# Patient Record
Sex: Female | Born: 1995 | Race: Black or African American | Hispanic: No | Marital: Single | Smoking: Never smoker
Health system: Southern US, Community
[De-identification: ages and names within clinical notes are randomized; demographics above are authoritative.]

## PROBLEM LIST (undated history)

## (undated) DIAGNOSIS — Z789 Other specified health status: Secondary | ICD-10-CM

---

## 2016-01-14 ENCOUNTER — Encounter (HOSPITAL_COMMUNITY): Payer: Self-pay | Admitting: Emergency Medicine

## 2016-01-14 DIAGNOSIS — Z5321 Procedure and treatment not carried out due to patient leaving prior to being seen by health care provider: Secondary | ICD-10-CM | POA: Insufficient documentation

## 2016-01-14 DIAGNOSIS — R1110 Vomiting, unspecified: Secondary | ICD-10-CM | POA: Insufficient documentation

## 2016-01-14 LAB — COMPREHENSIVE METABOLIC PANEL
ALBUMIN: 4.3 g/dL (ref 3.5–5.0)
ALK PHOS: 45 U/L (ref 38–126)
ALT: 12 U/L — AB (ref 14–54)
AST: 22 U/L (ref 15–41)
Anion gap: 7 (ref 5–15)
BILIRUBIN TOTAL: 0.8 mg/dL (ref 0.3–1.2)
BUN: 9 mg/dL (ref 6–20)
CALCIUM: 9.5 mg/dL (ref 8.9–10.3)
CO2: 24 mmol/L (ref 22–32)
CREATININE: 0.91 mg/dL (ref 0.44–1.00)
Chloride: 106 mmol/L (ref 101–111)
GFR calc Af Amer: >60 mL/min (ref >60)
GFR calc non Af Amer: >60 mL/min (ref >60)
GLUCOSE: 105 mg/dL — AB (ref 65–99)
Potassium: 3.2 mmol/L — ABNORMAL LOW (ref 3.5–5.1)
SODIUM: 137 mmol/L (ref 135–145)
TOTAL PROTEIN: 7.5 g/dL (ref 6.5–8.1)

## 2016-01-14 LAB — URINALYSIS, ROUTINE W REFLEX MICROSCOPIC
Glucose, UA: NEGATIVE mg/dL
HGB URINE DIPSTICK: NEGATIVE
KETONES UR: NEGATIVE mg/dL
Leukocytes, UA: NEGATIVE
Nitrite: NEGATIVE
PROTEIN: NEGATIVE mg/dL
SPECIFIC GRAVITY, URINE: 1.031 — AB (ref 1.005–1.030)
pH: 5 (ref 5.0–8.0)

## 2016-01-14 LAB — CBC
HCT: 35.8 % — ABNORMAL LOW (ref 36.0–46.0)
Hemoglobin: 12.9 g/dL (ref 12.0–15.0)
MCH: 31.5 pg (ref 26.0–34.0)
MCHC: 36 g/dL (ref 30.0–36.0)
MCV: 87.3 fL (ref 78.0–100.0)
PLATELETS: 220 10*3/uL (ref 150–400)
RBC: 4.1 MIL/uL (ref 3.87–5.11)
RDW: 12.1 % (ref 11.5–15.5)
WBC: 7 10*3/uL (ref 4.0–10.5)

## 2016-01-14 LAB — LIPASE, BLOOD: Lipase: 19 U/L (ref 11–51)

## 2016-01-14 MED ORDER — ONDANSETRON 4 MG PO TBDP
4.0000 mg | ORAL_TABLET | Freq: Once | ORAL | Status: AC | PRN
  Administered 2016-01-14: 4 mg via ORAL

## 2016-01-14 MED ORDER — ONDANSETRON 4 MG PO TBDP
ORAL_TABLET | ORAL | Status: AC
  Filled 2016-01-14: qty 1

## 2016-01-14 NOTE — ED Notes (Signed)
Pt reports emesis and ear congestion since this morning. Pt sts she had not been able to keep anything down since last night.

## 2016-01-15 ENCOUNTER — Emergency Department (HOSPITAL_COMMUNITY)
Admission: EM | Admit: 2016-01-15 | Discharge: 2016-01-15 | Disposition: A | Discharge status: Home / Self Care | Payer: Self-pay | Attending: Dermatology | Admitting: Dermatology

## 2016-02-04 ENCOUNTER — Encounter (HOSPITAL_COMMUNITY): Payer: Self-pay

## 2016-02-04 ENCOUNTER — Emergency Department (HOSPITAL_COMMUNITY)
Admission: EM | Admit: 2016-02-04 | Discharge: 2016-02-04 | Disposition: A | Discharge status: Home / Self Care | Payer: Medicaid Other | Attending: Emergency Medicine | Admitting: Emergency Medicine

## 2016-02-04 DIAGNOSIS — R112 Nausea with vomiting, unspecified: Secondary | ICD-10-CM | POA: Insufficient documentation

## 2016-02-04 LAB — COMPREHENSIVE METABOLIC PANEL
ALT: 15 U/L (ref 14–54)
AST: 25 U/L (ref 15–41)
Albumin: 4.8 g/dL (ref 3.5–5.0)
Alkaline Phosphatase: 47 U/L (ref 38–126)
Anion gap: 6 (ref 5–15)
BUN: 8 mg/dL (ref 6–20)
CO2: 27 mmol/L (ref 22–32)
Calcium: 9.3 mg/dL (ref 8.9–10.3)
Chloride: 105 mmol/L (ref 101–111)
Creatinine, Ser: 0.74 mg/dL (ref 0.44–1.00)
GFR calc Af Amer: >60 mL/min (ref >60)
GFR calc non Af Amer: >60 mL/min (ref >60)
Glucose, Bld: 97 mg/dL (ref 65–99)
Potassium: 3.8 mmol/L (ref 3.5–5.1)
Sodium: 138 mmol/L (ref 135–145)
Total Bilirubin: 0.5 mg/dL (ref 0.3–1.2)
Total Protein: 8.3 g/dL — ABNORMAL HIGH (ref 6.5–8.1)

## 2016-02-04 LAB — CBC
HCT: 38 % (ref 36.0–46.0)
Hemoglobin: 13.2 g/dL (ref 12.0–15.0)
MCH: 31.7 pg (ref 26.0–34.0)
MCHC: 34.7 g/dL (ref 30.0–36.0)
MCV: 91.3 fL (ref 78.0–100.0)
Platelets: 242 10*3/uL (ref 150–400)
RBC: 4.16 MIL/uL (ref 3.87–5.11)
RDW: 12.1 % (ref 11.5–15.5)
WBC: 8.1 10*3/uL (ref 4.0–10.5)

## 2016-02-04 LAB — URINALYSIS, ROUTINE W REFLEX MICROSCOPIC
Bilirubin Urine: NEGATIVE
Glucose, UA: NEGATIVE mg/dL
Hgb urine dipstick: NEGATIVE
Ketones, ur: NEGATIVE mg/dL
Leukocytes, UA: NEGATIVE
Nitrite: NEGATIVE
Protein, ur: 100 mg/dL — AB
Specific Gravity, Urine: 1.025 (ref 1.005–1.030)
pH: 8.5 — ABNORMAL HIGH (ref 5.0–8.0)

## 2016-02-04 LAB — URINE MICROSCOPIC-ADD ON

## 2016-02-04 LAB — LIPASE, BLOOD: Lipase: 15 U/L (ref 11–51)

## 2016-02-04 LAB — I-STAT BETA HCG BLOOD, ED (MC, WL, AP ONLY): I-stat hCG, quantitative: <5 m[IU]/mL (ref <5)

## 2016-02-04 MED ORDER — SODIUM CHLORIDE 0.9 % IV BOLUS (SEPSIS)
1000.0000 mL | Freq: Once | INTRAVENOUS | Status: AC
Start: 2016-02-04 — End: 2016-02-04
  Administered 2016-02-04: 1000 mL via INTRAVENOUS

## 2016-02-04 MED ORDER — ONDANSETRON HCL 4 MG PO TABS: 4.0000 mg | ORAL_TABLET | Freq: Four times a day (QID) | ORAL | Status: DC

## 2016-02-04 MED ORDER — PROMETHAZINE HCL 25 MG/ML IJ SOLN
25.0000 mg | Freq: Once | INTRAMUSCULAR | Status: AC
  Administered 2016-02-04: 25 mg via INTRAVENOUS
  Filled 2016-02-04: qty 1

## 2016-02-04 NOTE — ED Notes (Signed)
She is resting quietly in the presence of her boyfriend.

## 2016-02-04 NOTE — Discharge Instructions (Signed)
[facility],  Nice meeting you! Please follow-up with your primary care provider. Return to the emergency department if you develop fevers, are unable to keep foods down, have increasing pain, new/worsening symptoms. Feel better soon!  S. Lane HackerNicole Conn Trombetta, PA-C Nausea and Vomiting Nausea is a sick feeling that often comes before throwing up (vomiting). Vomiting is a reflex where stomach contents come out of your mouth. Vomiting can cause severe loss of body fluids (dehydration). Children and elderly adults can become dehydrated quickly, especially if they also have diarrhea. Nausea and vomiting are symptoms of a condition or disease. It is important to find the cause of your symptoms. CAUSES   Direct irritation of the stomach lining. This irritation can result from increased acid production (gastroesophageal reflux disease), infection, food poisoning, taking certain medicines (such as nonsteroidal anti-inflammatory drugs), alcohol use, or tobacco use.  Signals from the brain.These signals could be caused by a headache, heat exposure, an inner ear disturbance, increased pressure in the brain from injury, infection, a tumor, or a concussion, pain, emotional stimulus, or metabolic problems.  An obstruction in the gastrointestinal tract (bowel obstruction).  Illnesses such as diabetes, hepatitis, gallbladder problems, appendicitis, kidney problems, cancer, sepsis, atypical symptoms of a heart attack, or eating disorders.  Medical treatments such as chemotherapy and radiation.  Receiving medicine that makes you sleep (general anesthetic) during surgery. DIAGNOSIS Your caregiver may ask for tests to be done if the problems do not improve after a few days. Tests may also be done if symptoms are severe or if the reason for the nausea and vomiting is not clear. Tests may include:  Urine tests.  Blood tests.  Stool tests.  Cultures (to look for evidence of infection).  X-rays or other  imaging studies. Test results can help your caregiver make decisions about treatment or the need for additional tests. TREATMENT You need to stay well hydrated. Drink frequently but in small amounts.You may wish to drink water, sports drinks, clear broth, or eat frozen ice pops or gelatin dessert to help stay hydrated.When you eat, eating slowly may help prevent nausea.There are also some antinausea medicines that may help prevent nausea. HOME CARE INSTRUCTIONS   Take all medicine as directed by your caregiver.  If you do not have an appetite, do not force yourself to eat. However, you must continue to drink fluids.  If you have an appetite, eat a normal diet unless your caregiver tells you differently.  Eat a variety of complex carbohydrates (rice, wheat, potatoes, bread), lean meats, yogurt, fruits, and vegetables.  Avoid high-fat foods because they are more difficult to digest.  Drink enough water and fluids to keep your urine clear or pale yellow.  If you are dehydrated, ask your caregiver for specific rehydration instructions. Signs of dehydration may include:  Severe thirst.  Dry lips and mouth.  Dizziness.  Dark urine.  Decreasing urine frequency and amount.  Confusion.  Rapid breathing or pulse. SEEK IMMEDIATE MEDICAL CARE IF:   You have blood or brown flecks (like coffee grounds) in your vomit.  You have black or bloody stools.  You have a severe headache or stiff neck.  You are confused.  You have severe abdominal pain.  You have chest pain or trouble breathing.  You do not urinate at least once every 8 hours.  You develop cold or clammy skin.  You continue to vomit for longer than 24 to 48 hours.  You have a fever. MAKE SURE YOU:   Understand  these instructions.  Will watch your condition.  Will get help right away if you are not doing well or get worse.   This information is not intended to replace advice given to you by your health care  provider. Make sure you discuss any questions you have with your health care provider.   Document Released: 07/10/2005 Document Revised: 10/02/2011 Document Reviewed: 12/07/2010 Elsevier Interactive Patient Education [employer].

## 2016-02-04 NOTE — ED Notes (Signed)
Pt here with emesis and chills.  Pt had similar 3 weeks ago and [facility] but did not finish assessment.  Symptoms started today.  Bile yellow emesis.  No fever.  No change in urination or discharge.  Cannot tell when last menstrual was.  Using no birth control.

## 2016-03-05 NOTE — ED Provider Notes (Signed)
[facility]-EMERGENCY DEPT Provider Note   CSN: [id number] Arrival date & time: 02/04/16  1411  First Provider Contact:  First MD Initiated Contact with Patient 02/04/16 1446        History   Chief Complaint Chief Complaint  Patient presents with  . Emesis    HPI   HPI   Kendel Pesnell is a 20 y.o. female presenting with a 1 day history of emesis and chills. She describes her emesis as bilious. She denies fevers, hematemesis, abdominal pain, changes in bowel/bladder habits, urinary or vaginal complaints.   History reviewed. No pertinent past medical history.  There are no active problems to display for this patient.   History reviewed. No pertinent surgical history.  OB History    No data available       Home Medications    Prior to Admission medications   Medication Sig Start Date End Date Taking? Authorizing Provider  ondansetron (ZOFRAN) 4 MG tablet Take 1 tablet (4 mg total) by mouth every 6 (six) hours. 02/04/16   Melton Krebs, PA-C    Family History History reviewed. No pertinent family history.  Social History Social History  Substance Use Topics  . Smoking status: Never Smoker  . Smokeless tobacco: Not on file  . Alcohol use Yes     Comment: occasional     Allergies   Review of patient's allergies indicates no known allergies.   Review of Systems Review of Systems Ten systems are reviewed and are negative for acute change except as noted in the HPI   Physical Exam Updated Vital Signs BP 108/77 (BP Location: Left Arm)   Pulse 65   Temp 98.3 F (36.8 C) (Oral)   Resp 16   LMP 09/16/2015   SpO2 100%   Physical Exam  Constitutional: She appears well-developed and well-nourished. No distress.  HENT:  Head: Normocephalic and atraumatic.  Mouth/Throat: Oropharynx is clear and moist. No oropharyngeal exudate.  Eyes: Conjunctivae are normal. Pupils are equal, round, and reactive to light. Right eye exhibits no discharge. Left eye  exhibits no discharge. No scleral icterus.  Neck: No tracheal deviation present.  Cardiovascular: Normal rate, regular rhythm, normal heart sounds and intact distal pulses.  Exam reveals no gallop and no friction rub.   No murmur heard. Pulmonary/Chest: Effort normal and breath sounds normal. No respiratory distress. She has no wheezes. She has no rales. She exhibits no tenderness.  Abdominal: Soft. Bowel sounds are normal. She exhibits no distension and no mass. There is no tenderness. There is no rebound and no guarding.  Musculoskeletal: She exhibits no edema.  Lymphadenopathy:    She has no cervical adenopathy.  Neurological: She is alert. Coordination normal.  Skin: Skin is warm and dry. No rash noted. She is not diaphoretic. No erythema.  Psychiatric: She has a normal mood and affect. Her behavior is normal.  Nursing note and vitals reviewed.    ED Treatments / Results  Labs (all labs ordered are listed, but only abnormal results are displayed) Labs Reviewed  COMPREHENSIVE METABOLIC PANEL - Abnormal; Notable for the following:       Result Value   Total Protein 8.3 (*)    All other components within normal limits  URINALYSIS, ROUTINE W REFLEX MICROSCOPIC (NOT AT [facility]) - Abnormal; Notable for the following:    APPearance CLOUDY (*)    pH 8.5 (*)    Protein, ur 100 (*)    All other components within normal limits  URINE MICROSCOPIC-ADD ON -  Abnormal; Notable for the following:    Squamous Epithelial / LPF 6-30 (*)    Bacteria, UA FEW (*)    All other components within normal limits  LIPASE, BLOOD  CBC  I-STAT BETA HCG BLOOD, ED ([facility], [facility], [facility] ONLY)    EKG  EKG Interpretation None       Radiology No results found.  Procedures Procedures (including critical care time)  Medications Ordered in ED Medications  sodium chloride 0.9 % bolus 1,000 mL (0 mLs Intravenous Stopped 02/04/16 1704)  promethazine (PHENERGAN) injection 25 mg (25 mg Intravenous Given 02/04/16 1531)       Initial Impression / Assessment and Plan / ED Course  I have reviewed the triage vital signs and the nursing notes.  Pertinent labs & imaging results that were available during my care of the patient were reviewed by me and considered in my medical decision making (see chart for details).  Clinical Course     Final Clinical Impressions(s) / ED Diagnoses   Final diagnoses:  Nausea and vomiting, vomiting of unspecified type   Patient nontoxic appearing, VSS. Labs unremarkable. Patient nontender on exam. Patient may be safely discharged home. Discussed reasons for return. Patient to follow-up with primary care provider within one week. Patient in understanding and agreement with the plan.   New Prescriptions There are no discharge medications for this patient.    Melton KrebsSamantha Nicole Sherly Brodbeck, PA-C 03/05/16 2110    Tilden FossaElizabeth Rees, MD 03/08/16 [phone]

## 2016-06-26 DIAGNOSIS — S022XXD Fracture of nasal bones, subsequent encounter for fracture with routine healing: Secondary | ICD-10-CM | POA: Insufficient documentation

## 2017-01-30 DIAGNOSIS — O26841 Uterine size-date discrepancy, first trimester: Secondary | ICD-10-CM | POA: Diagnosis not present

## 2017-01-30 DIAGNOSIS — Z3A01 Less than 8 weeks gestation of pregnancy: Secondary | ICD-10-CM | POA: Diagnosis not present

## 2017-01-30 DIAGNOSIS — E559 Vitamin D deficiency, unspecified: Secondary | ICD-10-CM | POA: Diagnosis not present

## 2018-05-19 ENCOUNTER — Emergency Department (HOSPITAL_COMMUNITY)
Admission: EM | Admit: 2018-05-19 | Discharge: 2018-05-19 | Disposition: A | Discharge status: Home / Self Care | Payer: Medicaid Other | Attending: Emergency Medicine | Admitting: Emergency Medicine

## 2018-05-19 ENCOUNTER — Other Ambulatory Visit: Payer: Self-pay

## 2018-05-19 ENCOUNTER — Encounter (HOSPITAL_COMMUNITY): Payer: Self-pay | Admitting: *Deleted

## 2018-05-19 DIAGNOSIS — R51 Headache: Secondary | ICD-10-CM | POA: Diagnosis not present

## 2018-05-19 DIAGNOSIS — R112 Nausea with vomiting, unspecified: Secondary | ICD-10-CM | POA: Insufficient documentation

## 2018-05-19 DIAGNOSIS — R519 Headache, unspecified: Secondary | ICD-10-CM

## 2018-05-19 MED ORDER — PROCHLORPERAZINE EDISYLATE 10 MG/2ML IJ SOLN
10.0000 mg | Freq: Once | INTRAMUSCULAR | Status: AC
  Administered 2018-05-19: 10 mg via INTRAVENOUS
  Filled 2018-05-19: qty 2

## 2018-05-19 MED ORDER — SODIUM CHLORIDE 0.9 % IV BOLUS
500.0000 mL | Freq: Once | INTRAVENOUS | Status: AC
  Administered 2018-05-19: 500 mL via INTRAVENOUS

## 2018-05-19 MED ORDER — KETOROLAC TROMETHAMINE 30 MG/ML IJ SOLN
30.0000 mg | Freq: Once | INTRAMUSCULAR | Status: AC
  Administered 2018-05-19: 30 mg via INTRAVENOUS
  Filled 2018-05-19: qty 1

## 2018-05-19 MED ORDER — DIPHENHYDRAMINE HCL 50 MG/ML IJ SOLN
12.5000 mg | Freq: Once | INTRAMUSCULAR | Status: AC
  Administered 2018-05-19: 12.5 mg via INTRAVENOUS
  Filled 2018-05-19: qty 1

## 2018-05-19 NOTE — ED Triage Notes (Signed)
The pt is c/o a headache since yesterday  She has not taken anything  Over the counter for the pain  lmp this month

## 2018-05-19 NOTE — Discharge Instructions (Signed)
It was my pleasure taking care of you today!  Please follow up with your primary care doctor. If you do not have one, please see the information below.    Fortunately, your evaluation today is reassuring with no apparent emergent cause for your headache at this time. With that being said, it is VERY important that you monitor your symptoms at home. If you develop worsening headache, new fever, new neck stiffness, rash, weakness, numbness, trouble with your speech, trouble walking, new or worsening symptoms or any concerning symptoms, please return to the ED immediately.   To find a primary care or specialty doctor please call [phone] or [phone] to access "[place] Find a Doctor Service."  You may also go on the [facility] website at [email or web address]  There are also multiple [facility] practices throughout the [place] that are frequently accepting new patients. You may find a clinic that is close to your home and contact them.  [facility] [place] [address] [place] [address] [phone]  [facility] in [place] (also locations in [place] and [place]) - [address] [employer] [place] [phone]  [facility] - [address] [place] [phone]

## 2018-05-19 NOTE — ED Provider Notes (Signed)
[facility] EMERGENCY DEPARTMENT Provider Note   CSN: [id number] Arrival date & time: 05/19/18  1843     History   Chief Complaint Chief Complaint  Patient presents with  . Headache    HPI Makaia Rappa is a 22 y.o. female.  The history is provided by the patient and medical records. No language interpreter was used.  Headache   Associated symptoms include nausea. Pertinent negatives include no vomiting.   Maisy [place] is an otherwise healthy 22 y.o. female who presents to the emergency department complaining of a left-sided headache which began yesterday.  Associated with photophobia and nausea.  She has had a couple episodes of emesis as well.  No visual changes, numbness, weakness, tingling, neck pain or fevers.  She has not tried any medications prior to arrival for her symptoms.  She states that she tripped and fell down the stairs 2 weeks ago.  At that time she believes that she had her head, but did not lose consciousness.  She has never had any nausea or vomiting at that time.  She did not have a headache.  She felt fine.  When her headache began last night, she became concerned that P this was an injury from her fall 2 weeks ago.  History reviewed. No pertinent past medical history.  There are no active problems to display for this patient.   History reviewed. No pertinent surgical history.   OB History   None      Home Medications    Prior to Admission medications   Medication Sig Start Date End Date Taking? Authorizing Provider  ondansetron (ZOFRAN) 4 MG tablet Take 1 tablet (4 mg total) by mouth every 6 (six) hours. 02/04/16   Melton Krebs, PA-C    Family History No family history on file.  Social History Social History   Tobacco Use  . Smoking status: Never Smoker  . Smokeless tobacco: Never Used  Substance Use Topics  . Alcohol use: Yes    Comment: occasional  . Drug use: No     Allergies   Patient has  no known allergies.   Review of Systems Review of Systems  Gastrointestinal: Positive for nausea. Negative for abdominal pain, constipation, diarrhea and vomiting.  Neurological: Positive for headaches. Negative for dizziness, syncope, speech difficulty, weakness and numbness.  All other systems reviewed and are negative.    Physical Exam Updated Vital Signs BP 114/88 (BP Location: Right Arm)   Pulse 85   Temp 99.1 F (37.3 C) (Oral)   Resp 16   Ht 5\' 6"  (1.676 m)   Wt 49.9 kg   LMP 05/07/2018   SpO2 99%   BMI 17.76 kg/m   Physical Exam  Constitutional: She is oriented to person, place, and time. She appears well-developed and well-nourished. No distress.  HENT:  Head: Normocephalic and atraumatic.  Mouth/Throat: Oropharynx is clear and moist.  Eyes: Pupils are equal, round, and reactive to light. Conjunctivae and EOM are normal. No scleral icterus.  No nystagmus   Neck: Normal range of motion. Neck supple.  Full active and passive ROM without pain.  No midline or paraspinal tenderness. No nuchal rigidity or meningeal signs.  Cardiovascular: Normal rate, regular rhythm, normal heart sounds and intact distal pulses.  Pulmonary/Chest: Effort normal and breath sounds normal. No respiratory distress. She has no wheezes. She has no rales.  Abdominal: Soft. Bowel sounds are normal. She exhibits no distension. There is no tenderness. There is no rebound and  no guarding.  Musculoskeletal: Normal range of motion.  Lymphadenopathy:    She has no cervical adenopathy.  Neurological: She is alert and oriented to person, place, and time. She has normal reflexes. No cranial nerve deficit. Coordination normal.  Alert, oriented, thought content appropriate, able to give a coherent history. Speech is clear and goal oriented, able to follow commands.  Cranial Nerves:  II:  Peripheral visual fields grossly normal, pupils equal, round, reactive to light III, IV, VI: EOM intact bilaterally,  ptosis not present V,VII: smile symmetric, eyes kept closed tightly against resistance, facial light touch sensation equal VIII: hearing grossly normal IX, X: symmetric soft palate movement, uvula elevates symmetrically  XI: bilateral shoulder shrug symmetric and strong XII: midline tongue extension 5/5 muscle strength in upper and lower extremities bilaterally including strong and equal grip strength and dorsiflexion/plantar flexion Sensory to light touch normal in all four extremities.  Normal finger-to-nose and rapid alternating movements. No drift. Steady gait.  Skin: Skin is warm and dry. No rash noted. She is not diaphoretic.  Nursing note and vitals reviewed.    ED Treatments / Results  Labs (all labs ordered are listed, but only abnormal results are displayed) Labs Reviewed - No data to display  EKG None  Radiology No results found.  Procedures Procedures (including critical care time)  Medications Ordered in ED Medications  sodium chloride 0.9 % bolus 500 mL (500 mLs Intravenous New Bag/Given 05/19/18 2102)  ketorolac (TORADOL) 30 MG/ML injection 30 mg (30 mg Intravenous Given 05/19/18 2108)  prochlorperazine (COMPAZINE) injection 10 mg (10 mg Intravenous Given 05/19/18 2111)  diphenhydrAMINE (BENADRYL) injection 12.5 mg (12.5 mg Intravenous Given 05/19/18 2112)     Initial Impression / Assessment and Plan / ED Course  I have reviewed the triage vital signs and the nursing notes.  Pertinent labs & imaging results that were available during my care of the patient were reviewed by me and considered in my medical decision making (see chart for details).     Arlita Buffkin is a 22 y.o. female who presents to ED for headache which began last night. No focal neuro deficits on exam. Migraine cocktail and fluids given.   On re-evaluation, patient feels much improved. The patient denies any neurologic symptoms such as visual changes, focal numbness/weakness, balance  problems, confusion, or speech difficulty to suggest a life-threatening intracranial process such as intracranial hemorrhage or mass. The patient has no clotting risk factors thus venous sinus thrombosis is unlikely. No fevers, neck pain or nuchal rigidity to suggest meningitis. I feel that the patient is safe for discharge home at this time. PCP follow up strongly encouraged. I have reviewed return precautions including development of neurologic symptoms, confusion, lethargy, difficulty speaking, or new/worsening/concerning symptoms. All questions answered.   Final Clinical Impressions(s) / ED Diagnoses   Final diagnoses:  Bad headache    ED Discharge Orders    None       Deron Poole, Chase Picket, PA-C 05/19/18 2201    Virgina Norfolk, DO 05/19/18 2333

## 2018-05-29 ENCOUNTER — Encounter (HOSPITAL_COMMUNITY): Payer: Self-pay | Admitting: Emergency Medicine

## 2018-05-29 ENCOUNTER — Emergency Department (HOSPITAL_COMMUNITY)
Admission: EM | Admit: 2018-05-29 | Discharge: 2018-05-29 | Disposition: A | Discharge status: Home / Self Care | Payer: Medicaid Other | Attending: Emergency Medicine | Admitting: Emergency Medicine

## 2018-05-29 ENCOUNTER — Other Ambulatory Visit: Payer: Self-pay

## 2018-05-29 DIAGNOSIS — H60391 Other infective otitis externa, right ear: Secondary | ICD-10-CM | POA: Diagnosis not present

## 2018-05-29 DIAGNOSIS — Z79899 Other long term (current) drug therapy: Secondary | ICD-10-CM | POA: Insufficient documentation

## 2018-05-29 DIAGNOSIS — H9201 Otalgia, right ear: Secondary | ICD-10-CM | POA: Diagnosis present

## 2018-05-29 MED ORDER — CIPROFLOXACIN-DEXAMETHASONE 0.3-0.1 % OT SUSP
4.0000 [drp] | Freq: Two times a day (BID) | OTIC | Status: AC
Start: 2018-05-29 — End: 2018-05-29
  Administered 2018-05-29: 4 [drp] via OTIC
  Filled 2018-05-29: qty 7.5

## 2018-05-29 NOTE — ED Triage Notes (Signed)
Patient with right ear pain for the last two days.  She states that she still having muffled sounds after trying some home remedies to help.  She states she tried irrigation with no luck of pain reduction and she thinks that she made it worse.

## 2018-05-29 NOTE — ED Provider Notes (Signed)
[facility] Provider Note   CSN: [id number] Arrival date & time: 05/29/18  2157     History   Chief Complaint Chief Complaint  Patient presents with  . Otalgia    HPI Lauren Hanna is a 22 y.o. female.  The history is provided by the patient and medical records.  Otalgia      22 year old female visiting ED with right ear pain.  States she feels like her ear is clogged.  She has been using OTC ear drops and trying to clean out her ear with q-tips.  States she thinks she actually made it worse.  States over the past 2 days her hearing sounds very muffled and distant.  No right ear issues.    History reviewed. No pertinent past medical history.  There are no active problems to display for this patient.   History reviewed. No pertinent surgical history.   OB History   None      Home Medications    Prior to Admission medications   Medication Sig Start Date End Date Taking? Authorizing Provider  ondansetron (ZOFRAN) 4 MG tablet Take 1 tablet (4 mg total) by mouth every 6 (six) hours. 02/04/16   Melton Krebs, PA-C    Family History No family history on file.  Social History Social History   Tobacco Use  . Smoking status: Never Smoker  . Smokeless tobacco: Never Used  Substance Use Topics  . Alcohol use: Yes    Comment: occasional  . Drug use: No     Allergies   Patient has no known allergies.   Review of Systems Review of Systems  HENT: Positive for ear pain.   All other systems reviewed and are negative.    Physical Exam Updated Vital Signs BP 106/90 (BP Location: Right Arm)   Pulse 63   Temp 98 F (36.7 C) (Oral)   Resp 16   Ht 5\' 5"  (1.651 m)   Wt 63.5 kg   LMP 05/07/2018   SpO2 100%   BMI 23.30 kg/m   Physical Exam  Constitutional: She is oriented to person, place, and time. She appears well-developed and well-nourished.  HENT:  Head: Normocephalic and atraumatic.    Mouth/Throat: Oropharynx is clear and moist.  Right EAC is swollen with some yellow drainage in the canal; canal is tender to palpation and pain noted with pressure applied to tragus; TM appears clear  Eyes: Pupils are equal, round, and reactive to light. Conjunctivae and EOM are normal.  Neck: Normal range of motion.  Cardiovascular: Normal rate, regular rhythm and normal heart sounds.  Pulmonary/Chest: Effort normal and breath sounds normal.  Abdominal: Soft. Bowel sounds are normal.  Musculoskeletal: Normal range of motion.  Neurological: She is alert and oriented to person, place, and time.  Skin: Skin is warm and dry.  Psychiatric: She has a normal mood and affect.  Nursing note and vitals reviewed.    ED Treatments / Results  Labs (all labs ordered are listed, but only abnormal results are displayed) Labs Reviewed - No data to display  EKG None  Radiology No results found.  Procedures Procedures (including critical care time)  Medications Ordered in ED Medications  ciprofloxacin-dexamethasone (CIPRODEX) 0.3-0.1 % OTIC (EAR) suspension 4 drop (4 drops Right EAR Given 05/29/18 2356)     Initial Impression / Assessment and Plan / ED Course  I have reviewed the triage vital signs and the nursing notes.  Pertinent labs & imaging results that  were available during my care of the patient were reviewed by me and considered in my medical decision making (see chart for details).  22 year old female presenting to the ED with right ear pain.  States it feels "clogged" and now her hearing is muffled.  Has used over-the-counter eardrops and tried to clean her ears with Q-tips but thinks she made it worse.  On exam her right EAC is swollen and very tender to the touch.  She does have a small amount of yellow drainage present in the canal.  TM overall appears clear.  This is consistent with otitis externa.  She was started on Ciprodex drops, given first dose here and will continue at  home.  Follow-up closely with PCP.  She will return here for any new or worsening symptoms.  Final Clinical Impressions(s) / ED Diagnoses   Final diagnoses:  Other infective acute otitis externa of right ear    ED Discharge Orders    None       Garlon Hatchet, PA-C 05/30/18 0000    Raeford Razor, MD 05/30/18 1000

## 2018-05-29 NOTE — ED Notes (Signed)
Patient verbalizes understanding of discharge instructions. Opportunity for questioning and answers were provided. Armband removed by staff, pt discharged from ED home via POV.  

## 2018-05-29 NOTE — Discharge Instructions (Signed)
Continue using ear drops as directed. Follow-up with your primary care doctor. Return here for any new/acute changes.

## 2018-07-24 NOTE — L&D Delivery Note (Signed)
Patient: Lauren Hanna MRN: [id number]  GBS status: negative, IAP given NA  Patient is a 23 y.o. now G2P1001 s/p NSVD at [redacted]w[redacted]d, who was admitted for IOL. SROM 0h 38m prior to delivery with clear fluid.   Delivery Note At 10:58 PM a viable female was delivered via Vaginal, Spontaneous (Presentation:vertex ; LOA).  APGAR: 8, 9; weight pending .   Placenta status:delivered,intact.  Cord: 3-vessel with the following complications:none .  Cord pH: pending  Anesthesia:  none Episiotomy: None Lacerations: mild superficial intra-vaginal. Achieved hemostasis with manual pressure Suture Repair: none Est. Blood Loss (mL):    Mom to postpartum.  Baby to Couplet care / Skin to Skin.  Jaja Switalski L Mali Eppard 06/10/2019, 11:15 PM   Head delivered LOA. No nuchal cord present. Shoulder and body delivered in usual fashion. Infant with spontaneous cry, placed on mother's abdomen, dried and bulb suctioned. Cord clamped x 2 after 1-minute delay, and cut by father. Cord blood drawn. Placenta delivered spontaneously with gentle cord traction. IUD placed manually and cords cut at vaginal introitus. Fundus firm with massage and Pitocin. Perineum inspected and found to have mild superficial laceration, which was found to be hemostatic with manual pressure.

## 2018-10-14 DIAGNOSIS — R05 Cough: Secondary | ICD-10-CM | POA: Diagnosis not present

## 2018-10-14 DIAGNOSIS — J029 Acute pharyngitis, unspecified: Secondary | ICD-10-CM | POA: Diagnosis not present

## 2018-10-24 DIAGNOSIS — Z3201 Encounter for pregnancy test, result positive: Secondary | ICD-10-CM | POA: Diagnosis not present

## 2018-11-26 DIAGNOSIS — Z3201 Encounter for pregnancy test, result positive: Secondary | ICD-10-CM | POA: Diagnosis not present

## 2018-11-27 DIAGNOSIS — O26891 Other specified pregnancy related conditions, first trimester: Secondary | ICD-10-CM | POA: Diagnosis not present

## 2018-11-27 DIAGNOSIS — R102 Pelvic and perineal pain: Secondary | ICD-10-CM | POA: Diagnosis not present

## 2018-11-27 DIAGNOSIS — O26841 Uterine size-date discrepancy, first trimester: Secondary | ICD-10-CM | POA: Diagnosis not present

## 2018-11-27 DIAGNOSIS — Z3A12 12 weeks gestation of pregnancy: Secondary | ICD-10-CM | POA: Diagnosis not present

## 2018-11-27 DIAGNOSIS — R1030 Lower abdominal pain, unspecified: Secondary | ICD-10-CM | POA: Diagnosis not present

## 2018-12-18 DIAGNOSIS — Z3201 Encounter for pregnancy test, result positive: Secondary | ICD-10-CM | POA: Diagnosis not present

## 2018-12-18 DIAGNOSIS — Z113 Encounter for screening for infections with a predominantly sexual mode of transmission: Secondary | ICD-10-CM | POA: Diagnosis not present

## 2018-12-18 DIAGNOSIS — N912 Amenorrhea, unspecified: Secondary | ICD-10-CM | POA: Diagnosis not present

## 2018-12-18 DIAGNOSIS — O26819 Pregnancy related exhaustion and fatigue, unspecified trimester: Secondary | ICD-10-CM | POA: Diagnosis not present

## 2018-12-18 DIAGNOSIS — O26811 Pregnancy related exhaustion and fatigue, first trimester: Secondary | ICD-10-CM | POA: Diagnosis not present

## 2019-01-15 DIAGNOSIS — Z36 Encounter for antenatal screening for chromosomal anomalies: Secondary | ICD-10-CM | POA: Diagnosis not present

## 2019-01-21 DIAGNOSIS — Z363 Encounter for antenatal screening for malformations: Secondary | ICD-10-CM | POA: Diagnosis not present

## 2019-02-17 ENCOUNTER — Inpatient Hospital Stay (HOSPITAL_COMMUNITY)
Admission: EM | Admit: 2019-02-17 | Discharge: 2019-02-17 | Disposition: A | Discharge status: Home / Self Care | Payer: Medicaid Other | Attending: Obstetrics and Gynecology | Admitting: Obstetrics and Gynecology

## 2019-02-17 ENCOUNTER — Encounter (HOSPITAL_COMMUNITY): Payer: Self-pay

## 2019-02-17 ENCOUNTER — Other Ambulatory Visit: Payer: Self-pay

## 2019-02-17 DIAGNOSIS — D649 Anemia, unspecified: Secondary | ICD-10-CM | POA: Insufficient documentation

## 2019-02-17 DIAGNOSIS — R519 Headache, unspecified: Secondary | ICD-10-CM

## 2019-02-17 DIAGNOSIS — Z3A25 25 weeks gestation of pregnancy: Secondary | ICD-10-CM | POA: Diagnosis not present

## 2019-02-17 DIAGNOSIS — O99012 Anemia complicating pregnancy, second trimester: Secondary | ICD-10-CM

## 2019-02-17 DIAGNOSIS — R51 Headache: Secondary | ICD-10-CM | POA: Insufficient documentation

## 2019-02-17 DIAGNOSIS — O26892 Other specified pregnancy related conditions, second trimester: Secondary | ICD-10-CM | POA: Insufficient documentation

## 2019-02-17 HISTORY — DX: Other specified health status: Z78.9

## 2019-02-17 LAB — CBC
HCT: 30.8 % — ABNORMAL LOW (ref 36.0–46.0)
Hemoglobin: 10.8 g/dL — ABNORMAL LOW (ref 12.0–15.0)
MCH: 32.5 pg (ref 26.0–34.0)
MCHC: 35.1 g/dL (ref 30.0–36.0)
MCV: 92.8 fL (ref 80.0–100.0)
Platelets: 205 10*3/uL (ref 150–400)
RBC: 3.32 MIL/uL — ABNORMAL LOW (ref 3.87–5.11)
RDW: 13 % (ref 11.5–15.5)
WBC: 11.8 10*3/uL — ABNORMAL HIGH (ref 4.0–10.5)
nRBC: 0 % (ref 0.0–0.2)

## 2019-02-17 LAB — URINALYSIS, ROUTINE W REFLEX MICROSCOPIC
Bilirubin Urine: NEGATIVE
Glucose, UA: NEGATIVE mg/dL
Hgb urine dipstick: NEGATIVE
Ketones, ur: NEGATIVE mg/dL
Leukocytes,Ua: NEGATIVE
Nitrite: NEGATIVE
Protein, ur: NEGATIVE mg/dL
Specific Gravity, Urine: 1.008 (ref 1.005–1.030)
pH: 7 (ref 5.0–8.0)

## 2019-02-17 MED ORDER — DEXAMETHASONE SODIUM PHOSPHATE 10 MG/ML IJ SOLN
10.0000 mg | Freq: Once | INTRAMUSCULAR | Status: AC
  Administered 2019-02-17: 10 mg via INTRAVENOUS
  Filled 2019-02-17: qty 1

## 2019-02-17 MED ORDER — METOCLOPRAMIDE HCL 5 MG/ML IJ SOLN
10.0000 mg | Freq: Once | INTRAMUSCULAR | Status: AC
  Administered 2019-02-17: 10 mg via INTRAVENOUS
  Filled 2019-02-17: qty 2

## 2019-02-17 MED ORDER — LACTATED RINGERS IV BOLUS
1000.0000 mL | Freq: Once | INTRAVENOUS | Status: AC
  Administered 2019-02-17: 1000 mL via INTRAVENOUS

## 2019-02-17 MED ORDER — DIPHENHYDRAMINE HCL 50 MG/ML IJ SOLN
25.0000 mg | Freq: Once | INTRAMUSCULAR | Status: AC
  Administered 2019-02-17: 25 mg via INTRAVENOUS
  Filled 2019-02-17: qty 1

## 2019-02-17 NOTE — Discharge Instructions (Signed)
[place] for [employer] at [facility]       Phone: [phone]  [place] for [employer] at [place]   Phone: [place] for [employer] at [place]  Phone: [place] for [employer] at [facility] [employer]  Phone: [place] for [employer] at [place]  Phone: [place] for [place] at [facility]   Phone: [facility]       Phone: [phone]  [facility]    Phone: [phone]   [facility]    Phone: [phone]  [facility]    Phone: [place]    Phone: [phone]  [place] [facility]       Phone: [phone]   [place] [facility]  Phone: [place]    Phone: [phone]  [facility]   Phone: [phone]  [facility]      Phone: [phone]  [facility]    Phone: [phone]    General Headache Without Cause A headache is pain or discomfort that is felt around the head or neck area. There are many causes and types of headaches. In some cases, the cause may not be found. Follow these instructions at home: Watch your condition for any changes. Let your doctor know about them. Take these steps to help with your condition: Managing pain      Take over-the-counter and prescription medicines only as told by your doctor.  Lie down in a dark, quiet room when you have a headache.  If told, put ice on your head and neck area: ? Put ice in a plastic bag. ? Place a towel between your skin and the bag. ? Leave the ice on for 20 minutes, 2-3 times per day.  If told, put heat on the affected area. Use the heat source that your doctor recommends, such as a moist heat pack or a  heating pad. ? Place a towel between your skin and the heat source. ? Leave the heat on for 20-30 minutes. ? Remove the heat if your skin turns bright red. This is very important if you are unable to feel pain, heat, or cold. You may have a greater risk of getting burned.  Keep lights dim if bright lights bother you or make your headaches worse. Eating and drinking  Eat meals on a regular schedule.  If you drink alcohol: ? Limit how much you use to:  0-1 drink a day for women.  0-2 drinks a day for men. ? Be aware of how much alcohol is in your drink. In the [place] one drink equals one 12 oz bottle of beer (355 mL), one 5 oz glass of wine (148 mL), or one 1 oz glass of hard liquor (44 mL).  Stop drinking caffeine, or reduce how much caffeine you drink. General instructions   Keep a journal to find out if certain things bring on headaches. For example, write down: ? What you eat and drink. ? How much sleep you get. ? Any change to your diet or medicines.  Get a massage or try other ways to relax.  Limit stress.  Sit up straight. Do not tighten (tense) your muscles.  Do not use any products that contain nicotine or tobacco. This includes cigarettes, e-cigarettes, and chewing tobacco. If you need help quitting, ask  your doctor.  Exercise regularly as told by your doctor.  Get enough sleep. This often means 7-9 hours of sleep each night.  Keep all follow-up visits as told by your doctor. This is important. Contact a doctor if:  Your symptoms are not helped by medicine.  You have a headache that feels different than the other headaches.  You feel sick to your stomach (nauseous) or you throw up (vomit).  You have a fever. Get help right away if:  Your headache gets very bad quickly.  Your headache gets worse after a lot of physical activity.  You keep throwing up.  You have a stiff neck.  You have trouble seeing.  You have trouble speaking.  You have pain in  the eye or ear.  Your muscles are weak or you lose muscle control.  You lose your balance or have trouble walking.  You feel like you will pass out (faint) or you pass out.  You are mixed up (confused).  You have a seizure. Summary  A headache is pain or discomfort that is felt around the head or neck area.  There are many causes and types of headaches. In some cases, the cause may not be found.  Keep a journal to help find out what causes your headaches. Watch your condition for any changes. Let your doctor know about them.  Contact a doctor if you have a headache that is different from usual, or if your headache is not helped by medicine.  Get help right away if your headache gets very bad, you throw up, you have trouble seeing, you lose your balance, or you have a seizure. This information is not intended to replace advice given to you by your health care provider. Make sure you discuss any questions you have with your health care provider. Document Released: 04/18/2008 Document Revised: 01/28/2018 Document Reviewed: 01/28/2018 Elsevier Patient Education  2020 [employer]

## 2019-02-17 NOTE — MAU Provider Note (Signed)
History     CSN: [id number]  Arrival date and time: 02/17/19 7829   First Provider Initiated Contact with Patient 02/17/19 2002      Chief Complaint  Patient presents with  . Headache   23 y.o. G2P1001 @25 .0 wks presenting with HA. Reports onset 2 days ago. Located frontal. Rates 7/10. Has taken Tylenol multiple times w/o relief. Associated sx are nausea. She is eating and drinking well. Reports good FM. No VB, LOF, ctx. She was getting care in [place] but is living here now and needs to transfer care. Denies any pregnancy complications.  OB History    Gravida  2   Para  1   Term  1   Preterm      AB      Living  1     SAB      TAB      Ectopic      Multiple      Live Births              Past Medical History:  Diagnosis Date  . Medical history non-contributory     History reviewed. No pertinent surgical history.  History reviewed. No pertinent family history.  Social History   Tobacco Use  . Smoking status: Never Smoker  . Smokeless tobacco: Never Used  Substance Use Topics  . Alcohol use: Not Currently    Comment: not while preg  . Drug use: No    Allergies: No Known Allergies  Medications Prior to Admission  Medication Sig Dispense Refill Last Dose  . ondansetron (ZOFRAN) 4 MG tablet Take 1 tablet (4 mg total) by mouth every 6 (six) hours. 12 tablet 0 More than a month at Unknown time    Review of Systems  Constitutional: Negative for fever.  Eyes: Negative for visual disturbance.  Gastrointestinal: Positive for nausea. Negative for abdominal pain and vomiting.  Neurological: Positive for light-headedness and headaches. Negative for dizziness, syncope and weakness.   Physical Exam   Blood pressure 105/65, pulse 90, temperature 98.5 F (36.9 C), temperature source Oral, resp. rate 16, height 5\' 6"  (1.676 m), weight 57.5 kg, last menstrual period 05/07/2018, SpO2 100 %.  Physical Exam  Nursing note and vitals  reviewed. Constitutional: She is oriented to person, place, and time. She appears well-developed and well-nourished. No distress.  HENT:  Head: Normocephalic and atraumatic.  Eyes: Pupils are equal, round, and reactive to light. Conjunctivae and EOM are normal.  Neck: Normal range of motion.  Cardiovascular: Normal rate.  Respiratory: Effort normal. No respiratory distress.  Musculoskeletal: Normal range of motion.  Neurological: She is alert and oriented to person, place, and time. No cranial nerve deficit.  Skin: Skin is warm and dry.  Psychiatric: She has a normal mood and affect.  EFM: 145 bpm, mod variability, no accels, no decels Toco: none  Results for orders placed or performed during the hospital encounter of 02/17/19 (from the past 24 hour(s))  Urinalysis, Routine w reflex microscopic     Status: Abnormal   Collection Time: 02/17/19  8:09 PM  Result Value Ref Range   Color, Urine STRAW (A) YELLOW   APPearance CLEAR CLEAR   Specific Gravity, Urine 1.008 1.005 - 1.030   pH 7.0 5.0 - 8.0   Glucose, UA NEGATIVE NEGATIVE mg/dL   Hgb urine dipstick NEGATIVE NEGATIVE   Bilirubin Urine NEGATIVE NEGATIVE   Ketones, ur NEGATIVE NEGATIVE mg/dL   Protein, ur NEGATIVE NEGATIVE mg/dL   Nitrite NEGATIVE NEGATIVE  Leukocytes,Ua NEGATIVE NEGATIVE  CBC     Status: Abnormal   Collection Time: 02/17/19  8:30 PM  Result Value Ref Range   WBC 11.8 (H) 4.0 - 10.5 K/uL   RBC 3.32 (L) 3.87 - 5.11 MIL/uL   Hemoglobin 10.8 (L) 12.0 - 15.0 g/dL   HCT 16.130.8 (L) 09.636.0 - 04.546.0 %   MCV 92.8 80.0 - 100.0 fL   MCH 32.5 26.0 - 34.0 pg   MCHC 35.1 30.0 - 36.0 g/dL   RDW 40.913.0 81.111.5 - 91.415.5 %   Platelets 205 150 - 400 K/uL   nRBC 0.0 0.0 - 0.2 %   MAU Course  Procedures Orders Placed This Encounter  Procedures  . Urinalysis, Routine w reflex microscopic    Standing Status:   Standing    Number of Occurrences:   1  . CBC    Standing Status:   Standing    Number of Occurrences:   1  . Discharge  patient    Order Specific Question:   Discharge disposition    Answer:   01-Home or Self Care [1]    Order Specific Question:   Discharge patient date    Answer:   02/17/2019   Meds ordered this encounter  Medications  . lactated ringers bolus 1,000 mL  . metoCLOPramide (REGLAN) injection 10 mg  . dexamethasone (DECADRON) injection 10 mg  . diphenhydrAMINE (BENADRYL) injection 25 mg   MDM Labs ordered and reviewed. Normal neuro exam. HA significantly improved. Stable for discharge home.  Assessment and Plan   1. [redacted] weeks gestation of pregnancy   2. Acute nonintractable headache, unspecified headache type   3. Anemia during pregnancy in second trimester    Discharge home Follow up with OB provider of choice- list provided Return precautions  Allergies as of 02/17/2019   No Known Allergies     Medication List    STOP taking these medications   ondansetron 4 MG tablet Commonly known as: [place] FavorZOFRAN      Tonga Prout, CNM 02/17/2019, 9:10 PM

## 2019-02-17 NOTE — MAU Note (Signed)
Pt arrived from ed, no notification

## 2019-02-17 NOTE — MAU Note (Signed)
Pt reports bad migraine for last couple of days. Has been taking Tylenol but it isn't helping. No history of migraines. Denies pregnancy complications. Pt denies vision changes. Denies contractions, LOF or vaginal bleeding. Reports good fetal movement. Gets [facility] in [place] appt was 6/30. EDD November 9th

## 2019-02-25 ENCOUNTER — Ambulatory Visit (INDEPENDENT_AMBULATORY_CARE_PROVIDER_SITE_OTHER): Payer: Medicaid Other | Admitting: Advanced Practice Midwife

## 2019-02-25 ENCOUNTER — Encounter: Payer: Self-pay | Admitting: Advanced Practice Midwife

## 2019-02-25 ENCOUNTER — Other Ambulatory Visit: Payer: Self-pay

## 2019-02-25 VITALS — BP 92/60 | HR 88 | Wt 129.0 lb

## 2019-02-25 DIAGNOSIS — Z3A26 26 weeks gestation of pregnancy: Secondary | ICD-10-CM

## 2019-02-25 DIAGNOSIS — O0932 Supervision of pregnancy with insufficient antenatal care, second trimester: Secondary | ICD-10-CM

## 2019-02-25 DIAGNOSIS — Z3480 Encounter for supervision of other normal pregnancy, unspecified trimester: Secondary | ICD-10-CM | POA: Insufficient documentation

## 2019-02-25 DIAGNOSIS — O0930 Supervision of pregnancy with insufficient antenatal care, unspecified trimester: Secondary | ICD-10-CM

## 2019-02-25 DIAGNOSIS — Z3482 Encounter for supervision of other normal pregnancy, second trimester: Secondary | ICD-10-CM | POA: Diagnosis not present

## 2019-02-25 LAB — POCT URINALYSIS DIPSTICK OB
Blood, UA: NEGATIVE
Glucose, UA: NEGATIVE
Leukocytes, UA: NEGATIVE
Nitrite, UA: NEGATIVE
Spec Grav, UA: 1.015 (ref 1.010–1.025)
pH, UA: 6 (ref 5.0–8.0)

## 2019-02-25 MED ORDER — AMBULATORY NON FORMULARY MEDICATION: 1.0000 | 0 refills | Status: DC

## 2019-02-25 NOTE — Progress Notes (Deleted)
  Subjective:    Lauren Hanna is being seen today for her first obstetrical visit.  This {is/is not:9024} a planned pregnancy. She is at [redacted]w[redacted]d gestation. Her obstetrical history is significant for {ob risk factors:10154}. Relationship with FOB: {fob:16621}. Patient {does/does not:19097} intend to breast feed. Pregnancy history fully reviewed.  Patient reports {sx:14538}.  Review of Systems:   Review of Systems  Objective:     BP 92/60   Pulse 88   Wt 58.5 kg   LMP 08/28/2018 (Exact Date)   BMI 20.82 kg/m  Physical Exam  Exam    Assessment:    Pregnancy: G2P1001 Patient Active Problem List   Diagnosis Date Noted  . Supervision of other normal pregnancy, antepartum 02/25/2019       Plan:     Initial labs drawn. Prenatal vitamins. Problem list reviewed and updated. AFP3 discussed: {requests/ordered/declines:14581}. Role of ultrasound in pregnancy discussed; fetal survey: {requests/ordered/declines:14581}. Amniocentesis discussed: {amniocentesis:14582}. Follow up in {numbers 0-4:31231} weeks. ***% of *** min visit spent on counseling and coordination of care.  ***   Hansel Feinstein 02/25/2019

## 2019-02-25 NOTE — Patient Instructions (Signed)
Glucose Tolerance Test During Pregnancy Why am I having this test? The glucose tolerance test (GTT) is done to check how your body processes sugar (glucose). This is one of several tests used to diagnose diabetes that develops during pregnancy (gestational diabetes mellitus). Gestational diabetes is a temporary form of diabetes that some women develop during pregnancy. It usually occurs during the second trimester of pregnancy and goes away after delivery. Testing (screening) for gestational diabetes usually occurs between 24 and 28 weeks of pregnancy. You may have the GTT test after having a 1-hour glucose screening test if the results from that test indicate that you may have gestational diabetes. You may also have this test if:  You have a history of gestational diabetes.  You have a history of giving birth to very large babies or have experienced repeated fetal loss (stillbirth).  You have signs and symptoms of diabetes, such as: ? Changes in your vision. ? Tingling or numbness in your hands or feet. ? Changes in hunger, thirst, and urination that are not otherwise explained by your pregnancy. What is being tested? This test measures the amount of glucose in your blood at different times during a period of 3 hours. This indicates how well your body is able to process glucose. What kind of sample is taken?  Blood samples are required for this test. They are usually collected by inserting a needle into a blood vessel. How do I prepare for this test?  For 3 days before your test, eat normally. Have plenty of carbohydrate-rich foods.  Follow instructions from your health care provider about: ? Eating or drinking restrictions on the day of the test. You may be asked to not eat or drink anything other than water (fast) starting 8-10 hours before the test. ? Changing or stopping your regular medicines. Some medicines may interfere with this test. Tell a health care provider about:  All  medicines you are taking, including vitamins, herbs, eye drops, creams, and over-the-counter medicines.  Any blood disorders you have.  Any surgeries you have had.  Any medical conditions you have. What happens during the test? First, your blood glucose will be measured. This is referred to as your fasting blood glucose, since you fasted before the test. Then, you will drink a glucose solution that contains a certain amount of glucose. Your blood glucose will be measured again 1, 2, and 3 hours after drinking the solution. This test takes about 3 hours to complete. You will need to stay at the testing location during this time. During the testing period:  Do not eat or drink anything other than the glucose solution.  Do not exercise.  Do not use any products that contain nicotine or tobacco, such as cigarettes and e-cigarettes. If you need help stopping, ask your health care provider. The testing procedure may vary among health care providers and hospitals. How are the results reported? Your results will be reported as milligrams of glucose per deciliter of blood (mg/dL) or millimoles per liter (mmol/L). Your health care provider will compare your results to normal ranges that were established after testing a large group of people (reference ranges). Reference ranges may vary among labs and hospitals. For this test, common reference ranges are:  Fasting: less than 95-105 mg/dL (5.3-5.8 mmol/L).  1 hour after drinking glucose: less than 180-190 mg/dL (10.0-10.5 mmol/L).  2 hours after drinking glucose: less than 155-165 mg/dL (8.6-9.2 mmol/L).  3 hours after drinking glucose: 140-145 mg/dL (7.8-8.1 mmol/L). What do the   results mean? Results within reference ranges are considered normal, meaning that your glucose levels are well-controlled. If two or more of your blood glucose levels are high, you may be diagnosed with gestational diabetes. If only one level is high, your health care  provider may suggest repeat testing or other tests to confirm a diagnosis. Talk with your health care provider about what your results mean. Questions to ask your health care provider Ask your health care provider, or the department that is doing the test:  When will my results be ready?  How will I get my results?  What are my treatment options?  What other tests do I need?  What are my next steps? Summary  The glucose tolerance test (GTT) is one of several tests used to diagnose diabetes that develops during pregnancy (gestational diabetes mellitus). Gestational diabetes is a temporary form of diabetes that some women develop during pregnancy.  You may have the GTT test after having a 1-hour glucose screening test if the results from that test indicate that you may have gestational diabetes. You may also have this test if you have any symptoms or risk factors for gestational diabetes.  Talk with your health care provider about what your results mean. This information is not intended to replace advice given to you by your health care provider. Make sure you discuss any questions you have with your health care provider. Document Released: 01/09/2012 Document Revised: 10/31/2018 Document Reviewed: 02/19/2017 Elsevier Patient Education  2020 [employer] Second Trimester of Pregnancy The second trimester is from week 14 through week 27 (months 4 through 6). The second trimester is often a time when you feel your best. Your body has adjusted to being pregnant, and you begin to feel better physically. Usually, morning sickness has lessened or quit completely, you may have more energy, and you may have an increase in appetite. The second trimester is also a time when the fetus is growing rapidly. At the end of the sixth month, the fetus is about 9 inches long and weighs about 1 pounds. You will likely begin to feel the baby move (quickening) between 16 and 20 weeks of pregnancy. Body changes  during your second trimester Your body continues to go through many changes during your second trimester. The changes vary from woman to woman.  Your weight will continue to increase. You will notice your lower abdomen bulging out.  You may begin to get stretch marks on your hips, abdomen, and breasts.  You may develop headaches that can be relieved by medicines. The medicines should be approved by your health care provider.  You may urinate more often because the fetus is pressing on your bladder.  You may develop or continue to have heartburn as a result of your pregnancy.  You may develop constipation because certain hormones are causing the muscles that push waste through your intestines to slow down.  You may develop hemorrhoids or swollen, bulging veins (varicose veins).  You may have back pain. This is caused by: ? Weight gain. ? Pregnancy hormones that are relaxing the joints in your pelvis. ? A shift in weight and the muscles that support your balance.  Your breasts will continue to grow and they will continue to become tender.  Your gums may bleed and may be sensitive to brushing and flossing.  Dark spots or blotches (chloasma, mask of pregnancy) may develop on your face. This will likely fade after the baby is born.  A dark line from  your belly button to the pubic area (linea nigra) may appear. This will likely fade after the baby is born.  You may have changes in your hair. These can include thickening of your hair, rapid growth, and changes in texture. Some women also have hair loss during or after pregnancy, or hair that feels dry or thin. Your hair will most likely return to normal after your baby is born. What to expect at prenatal visits During a routine prenatal visit:  You will be weighed to make sure you and the fetus are growing normally.  Your blood pressure will be taken.  Your abdomen will be measured to track your baby's growth.  The fetal heartbeat  will be listened to.  Any test results from the previous visit will be discussed. Your health care provider may ask you:  How you are feeling.  If you are feeling the baby move.  If you have had any abnormal symptoms, such as leaking fluid, bleeding, severe headaches, or abdominal cramping.  If you are using any tobacco products, including cigarettes, chewing tobacco, and electronic cigarettes.  If you have any questions. Other tests that may be performed during your second trimester include:  Blood tests that check for: ? Low iron levels (anemia). ? High blood sugar that affects pregnant women (gestational diabetes) between 3724 and 28 weeks. ? Rh antibodies. This is to check for a protein on red blood cells (Rh factor).  Urine tests to check for infections, diabetes, or protein in the urine.  An ultrasound to confirm the proper growth and development of the baby.  An amniocentesis to check for possible genetic problems.  Fetal screens for spina bifida and Down syndrome.  HIV (human immunodeficiency virus) testing. Routine prenatal testing includes screening for HIV, unless you choose not to have this test. Follow these instructions at home: Medicines  Follow your health care provider's instructions regarding medicine use. Specific medicines may be either safe or unsafe to take during pregnancy.  Take a prenatal vitamin that contains at least 600 micrograms (mcg) of folic acid.  If you develop constipation, try taking a stool softener if your health care provider approves. Eating and drinking   Eat a balanced diet that includes fresh fruits and vegetables, whole grains, good sources of protein such as meat, eggs, or tofu, and low-fat dairy. Your health care provider will help you determine the amount of weight gain that is right for you.  Avoid raw meat and uncooked cheese. These carry germs that can cause birth defects in the baby.  If you have low calcium intake from  food, talk to your health care provider about whether you should take a daily calcium supplement.  Limit foods that are high in fat and processed sugars, such as fried and sweet foods.  To prevent constipation: ? Drink enough fluid to keep your urine clear or pale yellow. ? Eat foods that are high in fiber, such as fresh fruits and vegetables, whole grains, and beans. Activity  Exercise only as directed by your health care provider. Most women can continue their usual exercise routine during pregnancy. Try to exercise for 30 minutes at least 5 days a week. Stop exercising if you experience uterine contractions.  Avoid heavy lifting, wear low heel shoes, and practice good posture.  A sexual relationship may be continued unless your health care provider directs you otherwise. Relieving pain and discomfort  Wear a good support bra to prevent discomfort from breast tenderness.  Take warm sitz  baths to soothe any pain or discomfort caused by hemorrhoids. Use hemorrhoid cream if your health care provider approves.  Rest with your legs elevated if you have leg cramps or low back pain.  If you develop varicose veins, wear support hose. Elevate your feet for 15 minutes, 3-4 times a day. Limit salt in your diet. Prenatal Care  Write down your questions. Take them to your prenatal visits.  Keep all your prenatal visits as told by your health care provider. This is important. Safety  Wear your seat belt at all times when driving.  Make a list of emergency phone numbers, including numbers for family, friends, the hospital, and police and fire departments. General instructions  Ask your health care provider for a referral to a local prenatal education class. Begin classes no later than the beginning of month 6 of your pregnancy.  Ask for help if you have counseling or nutritional needs during pregnancy. Your health care provider can offer advice or refer you to specialists for help with  various needs.  Do not use hot tubs, steam rooms, or saunas.  Do not douche or use tampons or scented sanitary pads.  Do not cross your legs for long periods of time.  Avoid cat litter boxes and soil used by cats. These carry germs that can cause birth defects in the baby and possibly loss of the fetus by miscarriage or stillbirth.  Avoid all smoking, herbs, alcohol, and unprescribed drugs. Chemicals in these products can affect the formation and growth of the baby.  Do not use any products that contain nicotine or tobacco, such as cigarettes and e-cigarettes. If you need help quitting, ask your health care provider.  Visit your dentist if you have not gone yet during your pregnancy. Use a soft toothbrush to brush your teeth and be gentle when you floss. Contact a health care provider if:  You have dizziness.  You have mild pelvic cramps, pelvic pressure, or nagging pain in the abdominal area.  You have persistent nausea, vomiting, or diarrhea.  You have a bad smelling vaginal discharge.  You have pain when you urinate. Get help right away if:  You have a fever.  You are leaking fluid from your vagina.  You have spotting or bleeding from your vagina.  You have severe abdominal cramping or pain.  You have rapid weight gain or weight loss.  You have shortness of breath with chest pain.  You notice sudden or extreme swelling of your face, hands, ankles, feet, or legs.  You have not felt your baby move in over an hour.  You have severe headaches that do not go away when you take medicine.  You have vision changes. Summary  The second trimester is from week 14 through week 27 (months 4 through 6). It is also a time when the fetus is growing rapidly.  Your body goes through many changes during pregnancy. The changes vary from woman to woman.  Avoid all smoking, herbs, alcohol, and unprescribed drugs. These chemicals affect the formation and growth your baby.  Do not  use any tobacco products, such as cigarettes, chewing tobacco, and e-cigarettes. If you need help quitting, ask your health care provider.  Contact your health care provider if you have any questions. Keep all prenatal visits as told by your health care provider. This is important. This information is not intended to replace advice given to you by your health care provider. Make sure you discuss any questions you have with  your health care provider. Document Released: 07/04/2001 Document Revised: 11/01/2018 Document Reviewed: 08/15/2016 Elsevier Patient Education  2020 [employer]

## 2019-02-25 NOTE — Progress Notes (Signed)
  Subjective:    Lauren Hanna is being seen today for her first obstetrical visit.  This is not a planned pregnancy. She is at [redacted]w[redacted]d gestation. Her obstetrical history is significant for late prenatal care. States has not had any care at all.  States was livinig in [place] but never had care there.  Just moved here.   Relationship with FOB: significant other, living together. Patient does intend to breast feed. Pregnancy history fully reviewed.  Patient reports no complaints.  States first pregnancy was normal   No problems with delivery either.    Review of Systems:   Review of Systems  Constitutional: Negative for chills and fever.  Respiratory: Negative for shortness of breath.   Genitourinary: Negative for pelvic pain and vaginal bleeding.    Objective:     LMP 05/07/2018  Physical Exam  Constitutional: She is oriented to person, place, and time. She appears well-developed and well-nourished. No distress.  HENT:  Head: Normocephalic.  Cardiovascular: Normal rate and regular rhythm.  Respiratory: Effort normal. No respiratory distress.  GI: Soft. She exhibits no distension. There is no abdominal tenderness. There is no rebound and no guarding.  Genitourinary:    Genitourinary Comments: Pelvic deferred    Musculoskeletal: Normal range of motion.  Neurological: She is alert and oriented to person, place, and time.  Skin: Skin is warm and dry.  Psychiatric: She has a normal mood and affect.    Exam FHR 137 Refused GC/Chlamydia. States had it done in July   Assessment:    Pregnancy: G2P1001 Patient Active Problem List   Diagnosis Date Noted  . Supervision of other normal pregnancy, antepartum 02/25/2019  Single intrauterine pregnancy at [redacted]w[redacted]d     Plan:  Welcomed to practice Routines reviewed   Initial labs drawn. Prenatal vitamins. Problem list reviewed and updated. AFP3 discussed: declined.Too late.  Does want Panorama Role of ultrasound in pregnancy  discussed; fetal survey: ordered. Amniocentesis discussed: not indicated. Follow up in 2 weeks. 50% of 30 min visit spent on counseling and coordination of care.     Hansel Feinstein 02/25/2019

## 2019-02-27 LAB — HEMOGLOBINOPATHY EVALUATION
Ferritin: 13 ng/mL — ABNORMAL LOW (ref 15–150)
Hgb A2 Quant: 2.1 % (ref 1.8–3.2)
Hgb A: 97.9 % (ref 96.4–98.8)
Hgb C: 0 %
Hgb F Quant: 0 % (ref 0.0–2.0)
Hgb S: 0 %
Hgb Solubility: NEGATIVE
Hgb Variant: 0 %

## 2019-02-27 LAB — OBSTETRIC PANEL, INCLUDING HIV
Antibody Screen: NEGATIVE
Basophils Absolute: 0 10*3/uL (ref 0.0–0.2)
Basos: 1 %
EOS (ABSOLUTE): 0.2 10*3/uL (ref 0.0–0.4)
Eos: 2 %
HIV Screen 4th Generation wRfx: NONREACTIVE
Hematocrit: 29.3 % — ABNORMAL LOW (ref 34.0–46.6)
Hemoglobin: 9.9 g/dL — ABNORMAL LOW (ref 11.1–15.9)
Hepatitis B Surface Ag: NEGATIVE
Immature Grans (Abs): 0.1 10*3/uL (ref 0.0–0.1)
Immature Granulocytes: 1 %
Lymphocytes Absolute: 1.5 10*3/uL (ref 0.7–3.1)
Lymphs: 18 %
MCH: 32.1 pg (ref 26.6–33.0)
MCHC: 33.8 g/dL (ref 31.5–35.7)
MCV: 95 fL (ref 79–97)
Monocytes Absolute: 1 10*3/uL — ABNORMAL HIGH (ref 0.1–0.9)
Monocytes: 12 %
Neutrophils Absolute: 5.5 10*3/uL (ref 1.4–7.0)
Neutrophils: 66 %
Platelets: 210 10*3/uL (ref 150–450)
RBC: 3.08 x10E6/uL — ABNORMAL LOW (ref 3.77–5.28)
RDW: 13.3 % (ref 11.7–15.4)
RPR Ser Ql: NONREACTIVE
Rh Factor: POSITIVE
Rubella Antibodies, IGG: 4.13 index (ref >0.99)
WBC: 8.2 10*3/uL (ref 3.4–10.8)

## 2019-02-27 LAB — URINE CULTURE, OB REFLEX

## 2019-02-27 LAB — CULTURE, OB URINE

## 2019-03-05 ENCOUNTER — Ambulatory Visit (HOSPITAL_COMMUNITY)
Admission: RE | Admit: 2019-03-05 | Discharge: 2019-03-05 | Disposition: A | Discharge status: Home / Self Care | Payer: Medicaid Other | Source: Ambulatory Visit | Attending: Obstetrics and Gynecology | Admitting: Obstetrics and Gynecology

## 2019-03-05 ENCOUNTER — Other Ambulatory Visit: Payer: Self-pay

## 2019-03-05 DIAGNOSIS — Z3480 Encounter for supervision of other normal pregnancy, unspecified trimester: Secondary | ICD-10-CM | POA: Diagnosis not present

## 2019-03-05 DIAGNOSIS — Z3A26 26 weeks gestation of pregnancy: Secondary | ICD-10-CM

## 2019-03-05 DIAGNOSIS — Z3687 Encounter for antenatal screening for uncertain dates: Secondary | ICD-10-CM

## 2019-03-05 DIAGNOSIS — O0932 Supervision of pregnancy with insufficient antenatal care, second trimester: Secondary | ICD-10-CM

## 2019-03-05 DIAGNOSIS — Z363 Encounter for antenatal screening for malformations: Secondary | ICD-10-CM | POA: Diagnosis not present

## 2019-03-06 ENCOUNTER — Other Ambulatory Visit (HOSPITAL_COMMUNITY): Payer: Self-pay | Admitting: *Deleted

## 2019-03-06 DIAGNOSIS — Z362 Encounter for other antenatal screening follow-up: Secondary | ICD-10-CM

## 2019-03-28 ENCOUNTER — Other Ambulatory Visit: Payer: Self-pay

## 2019-03-28 ENCOUNTER — Ambulatory Visit (INDEPENDENT_AMBULATORY_CARE_PROVIDER_SITE_OTHER): Payer: Medicaid Other | Admitting: Obstetrics & Gynecology

## 2019-03-28 DIAGNOSIS — O0930 Supervision of pregnancy with insufficient antenatal care, unspecified trimester: Secondary | ICD-10-CM | POA: Insufficient documentation

## 2019-03-28 DIAGNOSIS — O99019 Anemia complicating pregnancy, unspecified trimester: Secondary | ICD-10-CM

## 2019-03-28 DIAGNOSIS — O0933 Supervision of pregnancy with insufficient antenatal care, third trimester: Secondary | ICD-10-CM

## 2019-03-28 DIAGNOSIS — O99013 Anemia complicating pregnancy, third trimester: Secondary | ICD-10-CM

## 2019-03-28 DIAGNOSIS — Z3A29 29 weeks gestation of pregnancy: Secondary | ICD-10-CM

## 2019-03-28 DIAGNOSIS — Z3480 Encounter for supervision of other normal pregnancy, unspecified trimester: Secondary | ICD-10-CM

## 2019-03-28 MED ORDER — INTEGRA F 125-1 MG PO CAPS: 1.0000 | ORAL_CAPSULE | Freq: Every day | ORAL | 1 refills | Status: DC

## 2019-03-28 NOTE — Patient Instructions (Signed)
Third Trimester of Pregnancy The third trimester is from week 28 through week 40 (months 7 through 9). The third trimester is a time when the unborn baby (fetus) is growing rapidly. At the end of the ninth month, the fetus is about 20 inches in length and weighs 6-10 pounds. Body changes during your third trimester Your body will continue to go through many changes during pregnancy. The changes vary from woman to woman. During the third trimester:  Your weight will continue to increase. You can expect to gain 25-35 pounds (11-16 kg) by the end of the pregnancy.  You may begin to get stretch marks on your hips, abdomen, and breasts.  You may urinate more often because the fetus is moving lower into your pelvis and pressing on your bladder.  You may develop or continue to have heartburn. This is caused by increased hormones that slow down muscles in the digestive tract.  You may develop or continue to have constipation because increased hormones slow digestion and cause the muscles that push waste through your intestines to relax.  You may develop hemorrhoids. These are swollen veins (varicose veins) in the rectum that can itch or be painful.  You may develop swollen, bulging veins (varicose veins) in your legs.  You may have increased body aches in the pelvis, back, or thighs. This is due to weight gain and increased hormones that are relaxing your joints.  You may have changes in your hair. These can include thickening of your hair, rapid growth, and changes in texture. Some women also have hair loss during or after pregnancy, or hair that feels dry or thin. Your hair will most likely return to normal after your baby is born.  Your breasts will continue to grow and they will continue to become tender. A yellow fluid (colostrum) may leak from your breasts. This is the first milk you are producing for your baby.  Your belly button may stick out.  You may notice more swelling in your hands,  face, or ankles.  You may have increased tingling or numbness in your hands, arms, and legs. The skin on your belly may also feel numb.  You may feel short of breath because of your expanding uterus.  You may have more problems sleeping. This can be caused by the size of your belly, increased need to urinate, and an increase in your body's metabolism.  You may notice the fetus "dropping," or moving lower in your abdomen (lightening).  You may have increased vaginal discharge.  You may notice your joints feel loose and you may have pain around your pelvic bone. What to expect at prenatal visits You will have prenatal exams every 2 weeks until week 36. Then you will have weekly prenatal exams. During a routine prenatal visit:  You will be weighed to make sure you and the baby are growing normally.  Your blood pressure will be taken.  Your abdomen will be measured to track your baby's growth.  The fetal heartbeat will be listened to.  Any test results from the previous visit will be discussed.  You may have a cervical check near your due date to see if your cervix has softened or thinned (effaced).  You will be tested for Group B streptococcus. This happens between 35 and 37 weeks. Your health care provider may ask you:  What your birth plan is.  How you are feeling.  If you are feeling the baby move.  If you have had any abnormal   symptoms, such as leaking fluid, bleeding, severe headaches, or abdominal cramping.  If you are using any tobacco products, including cigarettes, chewing tobacco, and electronic cigarettes.  If you have any questions. Other tests or screenings that may be performed during your third trimester include:  Blood tests that check for low iron levels (anemia).  Fetal testing to check the health, activity level, and growth of the fetus. Testing is done if you have certain medical conditions or if there are problems during the pregnancy.  Nonstress test  (NST). This test checks the health of your baby to make sure there are no signs of problems, such as the baby not getting enough oxygen. During this test, a belt is placed around your belly. The baby is made to move, and its heart rate is monitored during movement. What is false labor? False labor is a condition in which you feel small, irregular tightenings of the muscles in the womb (contractions) that usually go away with rest, changing position, or drinking water. These are called Braxton Hicks contractions. Contractions may last for hours, days, or even weeks before true labor sets in. If contractions come at regular intervals, become more frequent, increase in intensity, or become painful, you should see your health care provider. What are the signs of labor?  Abdominal cramps.  Regular contractions that start at 10 minutes apart and become stronger and more frequent with time.  Contractions that start on the top of the uterus and spread down to the lower abdomen and back.  Increased pelvic pressure and dull back pain.  A watery or bloody mucus discharge that comes from the vagina.  Leaking of amniotic fluid. This is also known as your "water breaking." It could be a slow trickle or a gush. Let your health care provider know if it has a color or strange odor. If you have any of these signs, call your health care provider right away, even if it is before your due date. Follow these instructions at home: Medicines  Follow your health care provider's instructions regarding medicine use. Specific medicines may be either safe or unsafe to take during pregnancy.  Take a prenatal vitamin that contains at least 600 micrograms (mcg) of folic acid.  If you develop constipation, try taking a stool softener if your health care provider approves. Eating and drinking   Eat a balanced diet that includes fresh fruits and vegetables, whole grains, good sources of protein such as meat, eggs, or tofu,  and low-fat dairy. Your health care provider will help you determine the amount of weight gain that is right for you.  Avoid raw meat and uncooked cheese. These carry germs that can cause birth defects in the baby.  If you have low calcium intake from food, talk to your health care provider about whether you should take a daily calcium supplement.  Eat four or five small meals rather than three large meals a day.  Limit foods that are high in fat and processed sugars, such as fried and sweet foods.  To prevent constipation: ? Drink enough fluid to keep your urine clear or pale yellow. ? Eat foods that are high in fiber, such as fresh fruits and vegetables, whole grains, and beans. Activity  Exercise only as directed by your health care provider. Most women can continue their usual exercise routine during pregnancy. Try to exercise for 30 minutes at least 5 days a week. Stop exercising if you experience uterine contractions.  Avoid heavy lifting.  Do   not exercise in extreme heat or humidity, or at high altitudes.  Wear low-heel, comfortable shoes.  Practice good posture.  You may continue to have sex unless your health care provider tells you otherwise. Relieving pain and discomfort  Take frequent breaks and rest with your legs elevated if you have leg cramps or low back pain.  Take warm sitz baths to soothe any pain or discomfort caused by hemorrhoids. Use hemorrhoid cream if your health care provider approves.  Wear a good support bra to prevent discomfort from breast tenderness.  If you develop varicose veins: ? Wear support pantyhose or compression stockings as told by your healthcare provider. ? Elevate your feet for 15 minutes, 3-4 times a day. Prenatal care  Write down your questions. Take them to your prenatal visits.  Keep all your prenatal visits as told by your health care provider. This is important. Safety  Wear your seat belt at all times when driving.  Make  a list of emergency phone numbers, including numbers for family, friends, the hospital, and police and fire departments. General instructions  Avoid cat litter boxes and soil used by cats. These carry germs that can cause birth defects in the baby. If you have a cat, ask someone to clean the litter box for you.  Do not travel far distances unless it is absolutely necessary and only with the approval of your health care provider.  Do not use hot tubs, steam rooms, or saunas.  Do not drink alcohol.  Do not use any products that contain nicotine or tobacco, such as cigarettes and e-cigarettes. If you need help quitting, ask your health care provider.  Do not use any medicinal herbs or unprescribed drugs. These chemicals affect the formation and growth of the baby.  Do not douche or use tampons or scented sanitary pads.  Do not cross your legs for long periods of time.  To prepare for the arrival of your baby: ? Take prenatal classes to understand, practice, and ask questions about labor and delivery. ? Make a trial run to the hospital. ? Visit the hospital and tour the maternity area. ? Arrange for maternity or paternity leave through employers. ? Arrange for family and friends to take care of pets while you are in the hospital. ? Purchase a rear-facing car seat and make sure you know how to install it in your car. ? Pack your hospital bag. ? Prepare the baby's nursery. Make sure to remove all pillows and stuffed animals from the baby's crib to prevent suffocation.  Visit your dentist if you have not gone during your pregnancy. Use a soft toothbrush to brush your teeth and be gentle when you floss. Contact a health care provider if:  You are unsure if you are in labor or if your water has broken.  You become dizzy.  You have mild pelvic cramps, pelvic pressure, or nagging pain in your abdominal area.  You have lower back pain.  You have persistent nausea, vomiting, or diarrhea.   You have an unusual or bad smelling vaginal discharge.  You have pain when you urinate. Get help right away if:  Your water breaks before 37 weeks.  You have regular contractions less than 5 minutes apart before 37 weeks.  You have a fever.  You are leaking fluid from your vagina.  You have spotting or bleeding from your vagina.  You have severe abdominal pain or cramping.  You have rapid weight loss or weight gain.  You have   shortness of breath with chest pain.  You notice sudden or extreme swelling of your face, hands, ankles, feet, or legs.  Your baby makes fewer than 10 movements in 2 hours.  You have severe headaches that do not go away when you take medicine.  You have vision changes. Summary  The third trimester is from week 28 through week 40, months 7 through 9. The third trimester is a time when the unborn baby (fetus) is growing rapidly.  During the third trimester, your discomfort may increase as you and your baby continue to gain weight. You may have abdominal, leg, and back pain, sleeping problems, and an increased need to urinate.  During the third trimester your breasts will keep growing and they will continue to become tender. A yellow fluid (colostrum) may leak from your breasts. This is the first milk you are producing for your baby.  False labor is a condition in which you feel small, irregular tightenings of the muscles in the womb (contractions) that eventually go away. These are called Braxton Hicks contractions. Contractions may last for hours, days, or even weeks before true labor sets in.  Signs of labor can include: abdominal cramps; regular contractions that start at 10 minutes apart and become stronger and more frequent with time; watery or bloody mucus discharge that comes from the vagina; increased pelvic pressure and dull back pain; and leaking of amniotic fluid. This information is not intended to replace advice given to you by your health  care provider. Make sure you discuss any questions you have with your health care provider. Document Released: 07/04/2001 Document Revised: 10/31/2018 Document Reviewed: 08/15/2016 Elsevier Patient Education  2020 [employer]  

## 2019-03-28 NOTE — Progress Notes (Signed)
   PRENATAL VISIT NOTE  Subjective:  Lauren Hanna is a 23 y.o. G2P1001 at [redacted]w[redacted]d being seen today for ongoing prenatal care.  She is currently monitored for the following issues for this low-risk pregnancy and has Supervision of other normal pregnancy, antepartum and Open fracture of nasal bone with routine healing on their problem list.  Patient reports works 60+ hours per week with limited sitting and sometimes the inability to stop and eat. c/o occ back pain. .  Contractions: Not present. Vag. Bleeding: None.  Movement: Present. Denies leaking of fluid.   The following portions of the patient's history were reviewed and updated as appropriate: allergies, current medications, past family history, past medical history, past social history, past surgical history and problem list.   Objective:   Vitals:   03/28/19 0913  BP: (!) 93/59  Pulse: 86  Weight: 134 lb (60.8 kg)    Fetal Status: Fetal Heart Rate (bpm): 139   Movement: Present     General:  Alert, oriented and cooperative. Patient is in no acute distress.  Skin: Skin is warm and dry. No rash noted.   Cardiovascular: Normal heart rate noted  Respiratory: Normal respiratory effort, no problems with respiration noted  Abdomen: Soft, gravid, appropriate for gestational age.  Pain/Pressure: Absent     Pelvic: Cervical exam deferred        Extremities: Normal range of motion.  Edema: None  Mental Status: Normal mood and affect. Normal behavior. Normal judgment and thought content.   Assessment and Plan:  Pregnancy: G2P1001 at [redacted]w[redacted]d 1. Supervision of other normal pregnancy, antepartum [place]: need f/u to complete anatomy 2. Late prenatal care.   3. Anemia ante Instructed to take FeSO4 daily with PNV\ Rx written for Integra. Not sure if covered by insurance. Discussed with pt.   Preterm labor symptoms and general obstetric precautions including but not limited to vaginal bleeding, contractions, leaking of fluid and fetal  movement were reviewed in detail with the patient. Please refer to After Visit Summary for other counseling recommendations.   Return in about 2 weeks (around 04/11/2019) for in person.  Future Appointments  Date Time Provider [place]  04/02/2019 11:00 AM [facility] [place] 5 [facility]    Lavonia Drafts, MD

## 2019-04-02 ENCOUNTER — Other Ambulatory Visit: Payer: Medicaid Other

## 2019-04-02 ENCOUNTER — Encounter (HOSPITAL_COMMUNITY): Payer: Self-pay

## 2019-04-02 ENCOUNTER — Ambulatory Visit (HOSPITAL_COMMUNITY): Payer: Medicaid Other

## 2019-04-08 ENCOUNTER — Other Ambulatory Visit: Payer: Self-pay

## 2019-04-08 ENCOUNTER — Ambulatory Visit (HOSPITAL_COMMUNITY)
Admission: RE | Admit: 2019-04-08 | Discharge: 2019-04-08 | Disposition: A | Discharge status: Home / Self Care | Payer: Medicaid Other | Source: Ambulatory Visit | Attending: Obstetrics and Gynecology | Admitting: Obstetrics and Gynecology

## 2019-04-08 DIAGNOSIS — Z3A31 31 weeks gestation of pregnancy: Secondary | ICD-10-CM

## 2019-04-08 DIAGNOSIS — Z362 Encounter for other antenatal screening follow-up: Secondary | ICD-10-CM

## 2019-04-08 DIAGNOSIS — O0933 Supervision of pregnancy with insufficient antenatal care, third trimester: Secondary | ICD-10-CM | POA: Diagnosis not present

## 2019-04-08 DIAGNOSIS — Z3687 Encounter for antenatal screening for uncertain dates: Secondary | ICD-10-CM

## 2019-04-09 ENCOUNTER — Other Ambulatory Visit: Payer: Medicaid Other

## 2019-04-15 ENCOUNTER — Encounter: Payer: Medicaid Other | Admitting: Advanced Practice Midwife

## 2019-04-23 ENCOUNTER — Inpatient Hospital Stay (HOSPITAL_COMMUNITY)
Admission: AD | Admit: 2019-04-23 | Discharge: 2019-04-23 | Disposition: A | Discharge status: Home / Self Care | Payer: Medicaid Other | Attending: Obstetrics & Gynecology | Admitting: Obstetrics & Gynecology

## 2019-04-23 ENCOUNTER — Encounter (HOSPITAL_COMMUNITY): Payer: Self-pay

## 2019-04-23 ENCOUNTER — Other Ambulatory Visit: Payer: Self-pay

## 2019-04-23 DIAGNOSIS — O99013 Anemia complicating pregnancy, third trimester: Secondary | ICD-10-CM

## 2019-04-23 DIAGNOSIS — O0933 Supervision of pregnancy with insufficient antenatal care, third trimester: Secondary | ICD-10-CM

## 2019-04-23 DIAGNOSIS — O4703 False labor before 37 completed weeks of gestation, third trimester: Secondary | ICD-10-CM

## 2019-04-23 DIAGNOSIS — O9989 Other specified diseases and conditions complicating pregnancy, childbirth and the puerperium: Secondary | ICD-10-CM | POA: Diagnosis not present

## 2019-04-23 DIAGNOSIS — Z3A33 33 weeks gestation of pregnancy: Secondary | ICD-10-CM | POA: Diagnosis not present

## 2019-04-23 DIAGNOSIS — M549 Dorsalgia, unspecified: Secondary | ICD-10-CM

## 2019-04-23 DIAGNOSIS — M545 Low back pain: Secondary | ICD-10-CM | POA: Diagnosis present

## 2019-04-23 DIAGNOSIS — O99019 Anemia complicating pregnancy, unspecified trimester: Secondary | ICD-10-CM

## 2019-04-23 DIAGNOSIS — Z3689 Encounter for other specified antenatal screening: Secondary | ICD-10-CM

## 2019-04-23 DIAGNOSIS — W010XXA Fall on same level from slipping, tripping and stumbling without subsequent striking against object, initial encounter: Secondary | ICD-10-CM | POA: Diagnosis not present

## 2019-04-23 DIAGNOSIS — O26893 Other specified pregnancy related conditions, third trimester: Secondary | ICD-10-CM | POA: Diagnosis not present

## 2019-04-23 DIAGNOSIS — W19XXXA Unspecified fall, initial encounter: Secondary | ICD-10-CM

## 2019-04-23 DIAGNOSIS — O0930 Supervision of pregnancy with insufficient antenatal care, unspecified trimester: Secondary | ICD-10-CM

## 2019-04-23 DIAGNOSIS — Z3480 Encounter for supervision of other normal pregnancy, unspecified trimester: Secondary | ICD-10-CM

## 2019-04-23 LAB — WET PREP, GENITAL
Clue Cells Wet Prep HPF POC: NONE SEEN
Sperm: NONE SEEN
Trich, Wet Prep: NONE SEEN

## 2019-04-23 LAB — URINALYSIS, ROUTINE W REFLEX MICROSCOPIC
Bilirubin Urine: NEGATIVE
Glucose, UA: NEGATIVE mg/dL
Hgb urine dipstick: NEGATIVE
Ketones, ur: NEGATIVE mg/dL
Leukocytes,Ua: NEGATIVE
Nitrite: NEGATIVE
Protein, ur: NEGATIVE mg/dL
Specific Gravity, Urine: 1.015 (ref 1.005–1.030)
pH: 8 (ref 5.0–8.0)

## 2019-04-23 MED ORDER — TERBUTALINE SULFATE 1 MG/ML IJ SOLN
0.2500 mg | Freq: Once | INTRAMUSCULAR | Status: AC
  Administered 2019-04-23: 0.25 mg via SUBCUTANEOUS
  Filled 2019-04-23: qty 1

## 2019-04-23 MED ORDER — CYCLOBENZAPRINE HCL 10 MG PO TABS
10.0000 mg | ORAL_TABLET | Freq: Three times a day (TID) | ORAL | Status: DC | PRN
  Administered 2019-04-23: 10 mg via ORAL
  Filled 2019-04-23: qty 1

## 2019-04-23 MED ORDER — CYCLOBENZAPRINE HCL 10 MG PO TABS: 10.0000 mg | ORAL_TABLET | Freq: Three times a day (TID) | ORAL | 0 refills | Status: DC | PRN

## 2019-04-23 MED ORDER — LACTATED RINGERS IV BOLUS
1000.0000 mL | Freq: Once | INTRAVENOUS | Status: AC
  Administered 2019-04-23: 1000 mL via INTRAVENOUS

## 2019-04-23 MED ORDER — NIFEDIPINE 10 MG PO CAPS
10.0000 mg | ORAL_CAPSULE | ORAL | Status: AC
  Administered 2019-04-23: 10 mg via ORAL
  Filled 2019-04-23 (×2): qty 1

## 2019-04-23 NOTE — MAU Provider Note (Signed)
History     CSN: [id number]  Arrival date and time: 04/23/19 1216   Chief Complaint  Patient presents with  . Back Pain   23 y.o. G2P1001 '@33'$ .1 wks presenting with back pain after a fall. Reports slipping on a wet floor and landing on her buttocks 5 days ago. She did not hit her abdomen or have LOC. She started to feel low back pain the day after. She took Tylenol and it helped the pain. She reports continuing to have the pain and worse on left side. Reports feeling ctx since last night and some are painful. Denies VB or LOF. Good FM. No recent IC. No urinary sx.   OB History    Gravida  2   Para  1   Term  1   Preterm      AB      Living  1     SAB      TAB      Ectopic      Multiple      Live Births  1           Past Medical History:  Diagnosis Date  . Medical history non-contributory     History reviewed. No pertinent surgical history.  Family History  Problem Relation Age of Onset  . Healthy Mother   . Healthy Father     Social History   Tobacco Use  . Smoking status: Never Smoker  . Smokeless tobacco: Never Used  Substance Use Topics  . Alcohol use: Not Currently    Comment: not while preg  . Drug use: No    Allergies: No Known Allergies  Medications Prior to Admission  Medication Sig Dispense Refill Last Dose  . Prenatal Vit-Fe Fumarate-FA (PRENATAL VITAMINS PO) Take by mouth.   04/23/2019 at 0700  . AMBULATORY NON FORMULARY MEDICATION 1 Device by Other route once a week. Blood pressure cuff/Medium  Monitored Regularly at home   ICD 10: Z34.90 LROB (Patient not taking: Reported on 03/28/2019) 1 kit 0   . Fe Fum-FePoly-FA-Vit C-Vit B3 (INTEGRA F) 125-1 MG CAPS Take 1 capsule by mouth daily. 30 capsule 1     Review of Systems  Gastrointestinal: Positive for abdominal pain.  Genitourinary: Negative for dysuria, hematuria, vaginal bleeding and vaginal discharge.  Musculoskeletal: Positive for back pain.   Physical Exam   Blood  pressure (!) 88/56, pulse (!) 107, temperature 98.8 F (37.1 C), temperature source Oral, resp. rate 20, last menstrual period 08/28/2018, SpO2 100 %.  Physical Exam  Nursing note and vitals reviewed. Constitutional: She is oriented to person, place, and time. She appears well-developed and well-nourished. No distress.  HENT:  Head: Normocephalic and atraumatic.  Neck: Normal range of motion.  Respiratory: Effort normal. No respiratory distress.  GI: Soft. She exhibits no distension. There is no abdominal tenderness.  gravid  Genitourinary:    Genitourinary Comments: VE: closed/thick   Musculoskeletal: Normal range of motion.  Neurological: She is alert and oriented to person, place, and time.  Skin: Skin is warm and dry.  Psychiatric: She has a normal mood and affect.  EFM: 140 bpm, mod variability, + accels, no decels Toco: q2-3  Results for orders placed or performed during the hospital encounter of 04/23/19 (from the past 24 hour(s))  Urinalysis, Routine w reflex microscopic     Status: None   Collection Time: 04/23/19 12:37 PM  Result Value Ref Range   Color, Urine YELLOW YELLOW   APPearance CLEAR CLEAR  Specific Gravity, Urine 1.015 1.005 - 1.030   pH 8.0 5.0 - 8.0   Glucose, UA NEGATIVE NEGATIVE mg/dL   Hgb urine dipstick NEGATIVE NEGATIVE   Bilirubin Urine NEGATIVE NEGATIVE   Ketones, ur NEGATIVE NEGATIVE mg/dL   Protein, ur NEGATIVE NEGATIVE mg/dL   Nitrite NEGATIVE NEGATIVE   Leukocytes,Ua NEGATIVE NEGATIVE  Wet prep, genital     Status: Abnormal   Collection Time: 04/23/19  1:50 PM   Specimen: PATH Cytology Cervicovaginal Ancillary Only  Result Value Ref Range   Yeast Wet Prep HPF POC PRESENT (A) NONE SEEN   Trich, Wet Prep NONE SEEN NONE SEEN   Clue Cells Wet Prep HPF POC NONE SEEN NONE SEEN   WBC, Wet Prep HPF POC MODERATE (A) NONE SEEN   Sperm NONE SEEN    MAU Course  Procedures Meds ordered this encounter  Medications  . lactated ringers bolus 1,000  mL  . cyclobenzaprine (FLEXERIL) tablet 10 mg  . NIFEdipine (PROCARDIA) capsule 10 mg  . terbutaline (BRETHINE) injection 0.25 mg  . cyclobenzaprine (FLEXERIL) 10 MG tablet    Sig: Take 1 tablet (10 mg total) by mouth 3 (three) times daily as needed (back pain).    Dispense:  30 tablet    Refill:  0    Order Specific Question:   Supervising Provider    Answer:   Emily Filbert [[id number]]   MDM Labs ordered and reviewed. No evidence of UTI or PTL. Back pain resolved after IVF and meds. No signs of abruption. Stable for discharge home.   Assessment and Plan  [redacted] weeks gestation Reactive NST S/p fall Preterm contractions Back pain in pregnancy Discharge home Follow up at [facility] next week as scheduled PTL/abruption precautions FMCs  Allergies as of 04/23/2019   No Known Allergies     Medication List    TAKE these medications   AMBULATORY NON FORMULARY MEDICATION 1 Device by Other route once a week. Blood pressure cuff/Medium  Monitored Regularly at home   ICD 10: Z34.90 LROB   cyclobenzaprine 10 MG tablet Commonly known as: FLEXERIL Take 1 tablet (10 mg total) by mouth 3 (three) times daily as needed (back pain).   Integra F 125-1 MG Caps Take 1 capsule by mouth daily.   PRENATAL VITAMINS PO Take by mouth.      Julianne Handler, CNM 04/23/2019, 4:34 PM

## 2019-04-23 NOTE — Discharge Instructions (Signed)
Braxton Hicks Contractions °Contractions of the uterus can occur throughout pregnancy, but they are not always a sign that you are in labor. You may have practice contractions called Braxton Hicks contractions. These false labor contractions are sometimes confused with true labor. °What are Braxton Hicks contractions? °Braxton Hicks contractions are tightening movements that occur in the muscles of the uterus before labor. Unlike true labor contractions, these contractions do not result in opening (dilation) and thinning of the cervix. Toward the end of pregnancy (32-34 weeks), Braxton Hicks contractions can happen more often and may become stronger. These contractions are sometimes difficult to tell apart from true labor because they can be very uncomfortable. You should not feel embarrassed if you go to the hospital with false labor. °Sometimes, the only way to tell if you are in true labor is for your health care provider to look for changes in the cervix. The health care provider will do a physical exam and may monitor your contractions. If you are not in true labor, the exam should show that your cervix is not dilating and your water has not broken. °If there are no other health problems associated with your pregnancy, it is completely safe for you to be sent home with false labor. You may continue to have Braxton Hicks contractions until you go into true labor. °How to tell the difference between true labor and false labor °True labor °· Contractions last 30-70 seconds. °· Contractions become very regular. °· Discomfort is usually felt in the top of the uterus, and it spreads to the lower abdomen and low back. °· Contractions do not go away with walking. °· Contractions usually become more intense and increase in frequency. °· The cervix dilates and gets thinner. °False labor °· Contractions are usually shorter and not as strong as true labor contractions. °· Contractions are usually irregular. °· Contractions  are often felt in the front of the lower abdomen and in the groin. °· Contractions may go away when you walk around or change positions while lying down. °· Contractions get weaker and are shorter-lasting as time goes on. °· The cervix usually does not dilate or become thin. °Follow these instructions at home: ° °· Take over-the-counter and prescription medicines only as told by your health care provider. °· Keep up with your usual exercises and follow other instructions from your health care provider. °· Eat and drink lightly if you think you are going into labor. °· If Braxton Hicks contractions are making you uncomfortable: °? Change your position from lying down or resting to walking, or change from walking to resting. °? Sit and rest in a tub of warm water. °? Drink enough fluid to keep your urine pale yellow. Dehydration may cause these contractions. °? Do slow and deep breathing several times an hour. °· Keep all follow-up prenatal visits as told by your health care provider. This is important. °Contact a health care provider if: °· You have a fever. °· You have continuous pain in your abdomen. °Get help right away if: °· Your contractions become stronger, more regular, and closer together. °· You have fluid leaking or gushing from your vagina. °· You pass blood-tinged mucus (bloody show). °· You have bleeding from your vagina. °· You have low back pain that you never had before. °· You feel your baby’s head pushing down and causing pelvic pressure. °· Your baby is not moving inside you as much as it used to. °Summary °· Contractions that occur before labor are   called Braxton Hicks contractions, false labor, or practice contractions.  Braxton Hicks contractions are usually shorter, weaker, farther apart, and less regular than true labor contractions. True labor contractions usually become progressively stronger and regular, and they become more frequent.  Manage discomfort from [facility] contractions  by changing position, resting in a warm bath, drinking plenty of water, or practicing deep breathing. This information is not intended to replace advice given to you by your health care provider. Make sure you discuss any questions you have with your health care provider. Document Released: 11/23/2016 Document Revised: 06/22/2017 Document Reviewed: 11/23/2016 Elsevier Patient Education  2020 [employer]   Back Pain in Pregnancy Back pain during pregnancy is common. Back pain may be caused by several factors that are related to changes during your pregnancy. Follow these instructions at home: Managing pain, stiffness, and swelling      If directed, for sudden (acute) back pain, put ice on the painful area. ? Put ice in a plastic bag. ? Place a towel between your skin and the bag. ? Leave the ice on for 20 minutes, 2-3 times per day.  If directed, apply heat to the affected area before you exercise. Use the heat source that your health care provider recommends, such as a moist heat pack or a heating pad. ? Place a towel between your skin and the heat source. ? Leave the heat on for 20-30 minutes. ? Remove the heat if your skin turns bright red. This is especially important if you are unable to feel pain, heat, or cold. You may have a greater risk of getting burned.  If directed, massage the affected area. Activity  Exercise as told by your health care provider. Gentle exercise is the best way to prevent or manage back pain.  Listen to your body when lifting. If lifting hurts, ask for help or bend your knees. This uses your leg muscles instead of your back muscles.  Squat down when picking up something from the floor. Do not bend over.  Only use bed rest for short periods as told by your health care provider. Bed rest should only be used for the most severe episodes of back pain. Standing, sitting, and lying down  Do not stand in one place for long periods of time.  Use good  posture when sitting. Make sure your head rests over your shoulders and is not hanging forward. Use a pillow on your lower back if necessary.  Try sleeping on your side, preferably the left side, with a pregnancy support pillow or 1-2 regular pillows between your legs. ? If you have back pain after a night's rest, your bed may be too soft. ? A firm mattress may provide more support for your back during pregnancy. General instructions  Do not wear high heels.  Eat a healthy diet. Try to gain weight within your health care provider's recommendations.  Use a maternity girdle, elastic sling, or back brace as told by your health care provider.  Take over-the-counter and prescription medicines only as told by your health care provider.  Work with a physical therapist or massage therapist to find ways to manage back pain. Acupuncture or massage therapy may be helpful.  Keep all follow-up visits as told by your health care provider. This is important. Contact a health care provider if:  Your back pain interferes with your daily activities.  You have increasing pain in other parts of your body. Get help right away if:  You develop  numbness, tingling, weakness, or problems with the use of your arms or legs.  You develop severe back pain that is not controlled with medicine.  You have a change in bowel or bladder control.  You develop shortness of breath, dizziness, or you faint.  You develop nausea, vomiting, or sweating.  You have back pain that is a rhythmic, cramping pain similar to labor pains. Labor pain is usually 1-2 minutes apart, lasts for about 1 minute, and involves a bearing down feeling or pressure in your pelvis.  You have back pain and your water breaks or you have vaginal bleeding.  You have back pain or numbness that travels down your leg.  Your back pain developed after you fell.  You develop pain on one side of your back.  You see blood in your urine.  You  develop skin blisters in the area of your back pain. Summary  Back pain may be caused by several factors that are related to changes during your pregnancy.  Follow instructions as told by your health care provider for managing pain, stiffness, and swelling.  Exercise as told by your health care provider. Gentle exercise is the best way to prevent or manage back pain.  Take over-the-counter and prescription medicines only as told by your health care provider.  Keep all follow-up visits as told by your health care provider. This is important. This information is not intended to replace advice given to you by your health care provider. Make sure you discuss any questions you have with your health care provider. Document Released: 10/18/2005 Document Revised: 10/29/2018 Document Reviewed: 12/26/2017 Elsevier Patient Education  2020 [employer].

## 2019-04-23 NOTE — MAU Note (Signed)
Patient reports back pain that started on Sunday, 9/27, after a fall. Patient states she did not hit her abdomen during her fall. Reports no VB, LOF, and positive fetal movement.

## 2019-04-24 LAB — GC/CHLAMYDIA PROBE AMP (~~LOC~~) NOT AT ARMC
Chlamydia: NEGATIVE
Molecular Disclaimer: NEGATIVE
Molecular Disclaimer: NORMAL
Neisseria Gonorrhea: NEGATIVE

## 2019-05-01 ENCOUNTER — Other Ambulatory Visit: Payer: Self-pay

## 2019-05-01 ENCOUNTER — Encounter: Payer: Self-pay | Admitting: *Deleted

## 2019-05-01 ENCOUNTER — Ambulatory Visit (INDEPENDENT_AMBULATORY_CARE_PROVIDER_SITE_OTHER): Payer: Medicaid Other | Admitting: Certified Nurse Midwife

## 2019-05-01 ENCOUNTER — Encounter: Payer: Self-pay | Admitting: Certified Nurse Midwife

## 2019-05-01 VITALS — BP 101/66 | HR 98 | Wt 141.0 lb

## 2019-05-01 DIAGNOSIS — O99891 Other specified diseases and conditions complicating pregnancy: Secondary | ICD-10-CM

## 2019-05-01 DIAGNOSIS — Z3480 Encounter for supervision of other normal pregnancy, unspecified trimester: Secondary | ICD-10-CM

## 2019-05-01 DIAGNOSIS — Z3A34 34 weeks gestation of pregnancy: Secondary | ICD-10-CM

## 2019-05-01 DIAGNOSIS — M549 Dorsalgia, unspecified: Secondary | ICD-10-CM

## 2019-05-01 MED ORDER — CYCLOBENZAPRINE HCL 10 MG PO TABS: 10.0000 mg | ORAL_TABLET | Freq: Three times a day (TID) | ORAL | 0 refills | Status: DC | PRN

## 2019-05-01 NOTE — Progress Notes (Signed)
Subjective:  Lauren Hanna is a 23 y.o. G2P1001 at [redacted]w[redacted]d being seen today for ongoing prenatal care.  She is currently monitored for the following issues for this low-risk pregnancy and has Supervision of other normal pregnancy, antepartum; Open fracture of nasal bone with routine healing; Anemia, antepartum; and Late prenatal care affecting pregnancy on their problem list.  Patient reports backache.  Contractions: Irritability. Vag. Bleeding: None.  Movement: Present. Denies leaking of fluid.   The following portions of the patient's history were reviewed and updated as appropriate: allergies, current medications, past family history, past medical history, past social history, past surgical history and problem list. Problem list updated.  Objective:   Vitals:   05/01/19 1059  BP: 101/66  Pulse: 98  Weight: 141 lb (64 kg)    Fetal Status: Fetal Heart Rate (bpm): 134 Fundal Height: 33 cm Movement: Present     General:  Alert, oriented and cooperative. Patient is in no acute distress.  Skin: Skin is warm and dry. No rash noted.   Cardiovascular: Normal heart rate noted  Respiratory: Normal respiratory effort, no problems with respiration noted  Abdomen: Soft, gravid, appropriate for gestational age. Pain/Pressure: Absent     Pelvic: Vag. Bleeding: None Vag D/C Character: Thin   Cervical exam deferred        Extremities: Normal range of motion.  Edema: None  Mental Status: Normal mood and affect. Normal behavior. Normal judgment and thought content.   Urinalysis:      Assessment and Plan:  Pregnancy: G2P1001 at [redacted]w[redacted]d  1. Supervision of other normal pregnancy, antepartum - GBS nv  2. Back pain affecting pregnancy in third trimester - Rx maternity belt - cyclobenzaprine (FLEXERIL) 10 MG tablet; Take 1 tablet (10 mg total) by mouth 3 (three) times daily as needed (back pain).  Dispense: 20 tablet; Refill: 0  Preterm labor symptoms and general obstetric precautions including but  not limited to vaginal bleeding, contractions, leaking of fluid and fetal movement were reviewed in detail with the patient. Please refer to After Visit Summary for other counseling recommendations.  Return in about 2 weeks (around 05/15/2019).   Julianne Handler, CNM

## 2019-05-06 ENCOUNTER — Other Ambulatory Visit: Payer: Self-pay

## 2019-05-06 ENCOUNTER — Telehealth: Payer: Self-pay | Admitting: *Deleted

## 2019-05-06 ENCOUNTER — Encounter (HOSPITAL_COMMUNITY): Payer: Self-pay

## 2019-05-06 ENCOUNTER — Inpatient Hospital Stay (HOSPITAL_COMMUNITY)
Admission: AD | Admit: 2019-05-06 | Discharge: 2019-05-06 | Disposition: A | Discharge status: Home / Self Care | Payer: Medicaid Other | Attending: Family Medicine | Admitting: Family Medicine

## 2019-05-06 DIAGNOSIS — Z3A35 35 weeks gestation of pregnancy: Secondary | ICD-10-CM | POA: Diagnosis not present

## 2019-05-06 DIAGNOSIS — O4703 False labor before 37 completed weeks of gestation, third trimester: Secondary | ICD-10-CM | POA: Diagnosis not present

## 2019-05-06 LAB — URINALYSIS, ROUTINE W REFLEX MICROSCOPIC
Bilirubin Urine: NEGATIVE
Glucose, UA: NEGATIVE mg/dL
Hgb urine dipstick: NEGATIVE
Ketones, ur: NEGATIVE mg/dL
Nitrite: NEGATIVE
Protein, ur: NEGATIVE mg/dL
Specific Gravity, Urine: 1.012 (ref 1.005–1.030)
pH: 8 (ref 5.0–8.0)

## 2019-05-06 NOTE — MAU Provider Note (Signed)
Chief Complaint:  Contractions, Vaginal Pain, and pelvic pressure   First Provider Initiated Contact with Patient 05/06/19 1526     HPI: Lauren Hanna is a 23 y.o. G2P1001 at 44w0dwho presents to maternity admissions reporting contractions. Started last night. More frequent since this morning. Feels like she's having at least 10 painful contractions per hour but she hasn't been timing them. Denies n/v/d, dysuria, vaginal discharge, vaginal bleeding, or LOF. Good fetal movement.   Location: abdomen Quality: contractions Severity: 6/10 in pain scale Duration: <1 day Timing: intermittent Modifying factors: none Associated signs and symptoms: none   Past Medical History:  Diagnosis Date  . Medical history non-contributory    OB History  Gravida Para Term Preterm AB Living  '2 1 1     1  ' SAB TAB Ectopic Multiple Live Births          1    # Outcome Date GA Lbr Len/2nd Weight Sex Delivery Anes PTL Lv  2 Current           1 Term 2019 [id number] g F Vag-Spont None N LIV   History reviewed. No pertinent surgical history. Family History  Problem Relation Age of Onset  . Healthy Mother   . Healthy Father    Social History   Tobacco Use  . Smoking status: Never Smoker  . Smokeless tobacco: Never Used  Substance Use Topics  . Alcohol use: Not Currently    Comment: not while preg  . Drug use: No   No Known Allergies Medications Prior to Admission  Medication Sig Dispense Refill Last Dose  . Prenatal Vit-Fe Fumarate-FA (PRENATAL VITAMINS PO) Take by mouth.   05/06/2019 at Unknown time  . AMBULATORY NON FORMULARY MEDICATION 1 Device by Other route once a week. Blood pressure cuff/Medium  Monitored Regularly at home   ICD 10: Z34.90 LROB 1 kit 0   . cyclobenzaprine (FLEXERIL) 10 MG tablet Take 1 tablet (10 mg total) by mouth 3 (three) times daily as needed (back pain). 30 tablet 0   . cyclobenzaprine (FLEXERIL) 10 MG tablet Take 1 tablet (10 mg total) by mouth 3 (three) times  daily as needed (back pain). 20 tablet 0   . Fe Fum-FePoly-FA-Vit C-Vit B3 (INTEGRA F) 125-1 MG CAPS Take 1 capsule by mouth daily. 30 capsule 1     I have reviewed patient's Past Medical Hx, Surgical Hx, Family Hx, Social Hx, medications and allergies.   ROS:  Review of Systems  Constitutional: Negative.   Gastrointestinal: Positive for abdominal pain. Negative for constipation, diarrhea, nausea and vomiting.  Genitourinary: Negative.     Physical Exam   Patient Vitals for the past 24 hrs:  BP Temp Temp src Pulse Resp SpO2 Height Weight  05/06/19 1458 100/68 98.3 F (36.8 C) Oral 100 16 100 % '5\' 6"'  (1.676 m) 63.4 kg    Constitutional: Well-developed, well-nourished female in no acute distress.  Cardiovascular: normal rate & rhythm, no murmur Respiratory: normal effort, lung sounds clear throughout GI: Abd soft, non-tender, gravid appropriate for gestational age. Pos BS x 4 MS: Extremities nontender, no edema, normal ROM Neurologic: Alert and oriented x 4.  GU:    Dilation: Closed Effacement (%): Thick Cervical Position: Posterior Exam by:: ErJorje GuildNP  NST:  Baseline: 140 bpm, Variability: Good {> 6 bpm), Accelerations: Reactive and Decelerations: Absent   Labs: Results for orders placed or performed during the hospital encounter of 05/06/19 (from the past 24 hour(s))  Urinalysis, Routine  w reflex microscopic     Status: Abnormal   Collection Time: 05/06/19  3:59 PM  Result Value Ref Range   Color, Urine YELLOW YELLOW   APPearance CLEAR CLEAR   Specific Gravity, Urine 1.012 1.005 - 1.030   pH 8.0 5.0 - 8.0   Glucose, UA NEGATIVE NEGATIVE mg/dL   Hgb urine dipstick NEGATIVE NEGATIVE   Bilirubin Urine NEGATIVE NEGATIVE   Ketones, ur NEGATIVE NEGATIVE mg/dL   Protein, ur NEGATIVE NEGATIVE mg/dL   Nitrite NEGATIVE NEGATIVE   Leukocytes,Ua LARGE (A) NEGATIVE   RBC / HPF 0-5 0 - 5 RBC/hpf   WBC, UA 0-5 0 - 5 WBC/hpf   Bacteria, UA RARE (A) NONE SEEN   Squamous  Epithelial / LPF 0-5 0 - 5    Imaging:  No results found.  MAU Course: Orders Placed This Encounter  Procedures  . Urinalysis, Routine w reflex microscopic  . Discharge patient   No orders of the defined types were placed in this encounter.   MDM: Cervix closed. Patient is contracting every 3-5 minutes. Reactive tracing.  Cervix remains closed after 1+ hr of monitoring. Offered tocolytic for patient comfort. Pt declines & is ok with being discharged home.    Assessment: 1. Preterm uterine contractions in third trimester, antepartum   2. [redacted] weeks gestation of pregnancy     Plan: Discharge home in stable condition.  Preterm Labor precautions and fetal kick counts Follow-up Information    [facility] Follow up.   Specialty: Obstetrics and Gynecology Why: return for worsening symptoms Contact information: [address] [id number] [place] [phone]          Allergies as of 05/06/2019   No Known Allergies     Medication List    TAKE these medications   AMBULATORY NON FORMULARY MEDICATION 1 Device by Other route once a week. Blood pressure cuff/Medium  Monitored Regularly at home   ICD 10: Z34.90 LROB   cyclobenzaprine 10 MG tablet Commonly known as: FLEXERIL Take 1 tablet (10 mg total) by mouth 3 (three) times daily as needed (back pain). What changed: Another medication with the same name was removed. Continue taking this medication, and follow the directions you see here.   Integra F 125-1 MG Caps Take 1 capsule by mouth daily.   PRENATAL VITAMINS PO Take by mouth.       Jorje Guild, NP 05/06/2019 5:01 PM

## 2019-05-06 NOTE — MAU Note (Signed)
Been having contractions since last night, getting worse.  Having really bad vaginal pain and pressure.

## 2019-05-06 NOTE — Telephone Encounter (Signed)
Pt called to after hours line during lunch stating that she is having ctx and vaginal pain/pressure.  Placed call to pt to verify she would be seen today as recommended by nurse call. Pt states she plans to be evaluated today as soon as her boyfriend is home to take her.  Pt states she is having occ ctx and increase in vaginal pressure.  Labor precautions given, pt advised to be evaluated today if she feels significant change in ctx/pressure.  Pt states understanding and states she will be seen.

## 2019-05-06 NOTE — Discharge Instructions (Signed)
Signs and Symptoms of Labor Labor is your body's natural process of moving your baby, placenta, and umbilical cord out of your uterus. The process of labor usually starts when your baby is full-term, between 37 and 40 weeks of pregnancy. How will I know when I am close to going into labor? As your body prepares for labor and the birth of your baby, you may notice the following symptoms in the weeks and days before true labor starts:  Having a strong desire to get your home ready to receive your new baby. This is called nesting. Nesting may be a sign that labor is approaching, and it may occur several weeks before birth. Nesting may involve cleaning and organizing your home.  Passing a small amount of thick, bloody mucus out of your vagina (normal bloody show or losing your mucus plug). This may happen more than a week before labor begins, or it might occur right before labor begins as the opening of the cervix starts to widen (dilate). For some women, the entire mucus plug passes at once. For others, smaller portions of the mucus plug may gradually pass over several days.  Your baby moving (dropping) lower in your pelvis to get into position for birth (lightening). When this happens, you may feel more pressure on your bladder and pelvic bone and less pressure on your ribs. This may make it easier to breathe. It may also cause you to need to urinate more often and have problems with bowel movements.  Having "practice contractions" (Braxton Hicks contractions) that occur at irregular (unevenly spaced) intervals that are more than 10 minutes apart. This is also called false labor. False labor contractions are common after exercise or sexual activity, and they will stop if you change position, rest, or drink fluids. These contractions are usually mild and do not get stronger over time. They may feel like: ? A backache or back pain. ? Mild cramps, similar to menstrual cramps. ? Tightening or pressure in  your abdomen. Other early symptoms that labor may be starting soon include:  Nausea or loss of appetite.  Diarrhea.  Having a sudden burst of energy, or feeling very tired.  Mood changes.  Having trouble sleeping. How will I know when labor has begun? Signs that true labor has begun may include:  Having contractions that come at regular (evenly spaced) intervals and increase in intensity. This may feel like more intense tightening or pressure in your abdomen that moves to your back. ? Contractions may also feel like rhythmic pain in your upper thighs or back that comes and goes at regular intervals. ? For first-time mothers, this change in intensity of contractions often occurs at a more gradual pace. ? Women who have given birth before may notice a more rapid progression of contraction changes.  Having a feeling of pressure in the vaginal area.  Your water breaking (rupture of membranes). This is when the sac of fluid that surrounds your baby breaks. When this happens, you will notice fluid leaking from your vagina. This may be clear or blood-tinged. Labor usually starts within 24 hours of your water breaking, but it may take longer to begin. ? Some women notice this as a gush of fluid. ? Others notice that their underwear repeatedly becomes damp. Follow these instructions at home:   When labor starts, or if your water breaks, call your health care provider or nurse care line. Based on your situation, they will determine when you should go in for an   exam.  When you are in early labor, you may be able to rest and manage symptoms at home. Some strategies to try at home include: ? Breathing and relaxation techniques. ? Taking a warm bath or shower. ? Listening to music. ? Using a heating pad on the lower back for pain. If you are directed to use heat:  Place a towel between your skin and the heat source.  Leave the heat on for 20-30 minutes.  Remove the heat if your skin turns  bright red. This is especially important if you are unable to feel pain, heat, or cold. You may have a greater risk of getting burned. Get help right away if:  You have painful, regular contractions that are 5 minutes apart or less.  Labor starts before you are [redacted] weeks along in your pregnancy.  You have a fever.  You have a headache that does not go away.  You have bright red blood coming from your vagina.  You do not feel your baby moving.  You have a sudden onset of: ? Severe headache with vision problems. ? Nausea, vomiting, or diarrhea. ? Chest pain or shortness of breath. These symptoms may be an emergency. If your health care provider recommends that you go to the hospital or birth center where you plan to deliver, do not drive yourself. Have someone else drive you, or call emergency services (911 in the [place] Summary  Labor is your body's natural process of moving your baby, placenta, and umbilical cord out of your uterus.  The process of labor usually starts when your baby is full-term, between 37 and 40 weeks of pregnancy.  When labor starts, or if your water breaks, call your health care provider or nurse care line. Based on your situation, they will determine when you should go in for an exam. This information is not intended to replace advice given to you by your health care provider. Make sure you discuss any questions you have with your health care provider. Document Released: 12/15/2016 Document Revised: 04/09/2017 Document Reviewed: 12/15/2016 Elsevier Patient Education  2020 [employer]  Fetal Movement Counts Patient Name: ________________________________________________ Patient Due Date: ____________________ What is a fetal movement count?  A fetal movement count is the number of times that you feel your baby move during a certain amount of time. This may also be called a fetal kick count. A fetal movement count is recommended for every pregnant woman. You may  be asked to start counting fetal movements as early as week 28 of your pregnancy. Pay attention to when your baby is most active. You may notice your baby's sleep and wake cycles. You may also notice things that make your baby move more. You should do a fetal movement count:  When your baby is normally most active.  At the same time each day. A good time to count movements is while you are resting, after having something to eat and drink. How do I count fetal movements? 1. Find a quiet, comfortable area. Sit, or lie down on your side. 2. Write down the date, the start time and stop time, and the number of movements that you felt between those two times. Take this information with you to your health care visits. 3. For 2 hours, count kicks, flutters, swishes, rolls, and jabs. You should feel at least 10 movements during 2 hours. 4. You may stop counting after you have felt 10 movements. 5. If you do not feel 10 movements in 2   hours, have something to eat and drink. Then, keep resting and counting for 1 hour. If you feel at least 4 movements during that hour, you may stop counting. Contact a health care provider if:  You feel fewer than 4 movements in 2 hours.  Your baby is not moving like he or she usually does. Date: ____________ Start time: ____________ Stop time: ____________ Movements: ____________ Date: ____________ Start time: ____________ Stop time: ____________ Movements: ____________ Date: ____________ Start time: ____________ Stop time: ____________ Movements: ____________ Date: ____________ Start time: ____________ Stop time: ____________ Movements: ____________ Date: ____________ Start time: ____________ Stop time: ____________ Movements: ____________ Date: ____________ Start time: ____________ Stop time: ____________ Movements: ____________ Date: ____________ Start time: ____________ Stop time: ____________ Movements: ____________ Date: ____________ Start time: ____________ Stop  time: ____________ Movements: ____________ Date: ____________ Start time: ____________ Stop time: ____________ Movements: ____________ This information is not intended to replace advice given to you by your health care provider. Make sure you discuss any questions you have with your health care provider. Document Released: 08/09/2006 Document Revised: 07/30/2018 Document Reviewed: 08/19/2015 Elsevier Patient Education  2020 Elsevier Inc.  

## 2019-05-15 ENCOUNTER — Encounter: Payer: Medicaid Other | Admitting: Family Medicine

## 2019-05-19 ENCOUNTER — Encounter (HOSPITAL_COMMUNITY): Payer: Self-pay

## 2019-05-19 ENCOUNTER — Inpatient Hospital Stay (HOSPITAL_COMMUNITY)
Admission: AD | Admit: 2019-05-19 | Discharge: 2019-05-19 | Disposition: A | Discharge status: Home / Self Care | Payer: Medicaid Other | Attending: Obstetrics and Gynecology | Admitting: Obstetrics and Gynecology

## 2019-05-19 ENCOUNTER — Other Ambulatory Visit: Payer: Self-pay

## 2019-05-19 DIAGNOSIS — Z3A37 37 weeks gestation of pregnancy: Secondary | ICD-10-CM | POA: Diagnosis not present

## 2019-05-19 DIAGNOSIS — O4700 False labor before 37 completed weeks of gestation, unspecified trimester: Secondary | ICD-10-CM

## 2019-05-19 DIAGNOSIS — O4703 False labor before 37 completed weeks of gestation, third trimester: Secondary | ICD-10-CM

## 2019-05-19 DIAGNOSIS — O471 False labor at or after 37 completed weeks of gestation: Secondary | ICD-10-CM | POA: Diagnosis not present

## 2019-05-19 DIAGNOSIS — Z3A36 36 weeks gestation of pregnancy: Secondary | ICD-10-CM

## 2019-05-19 LAB — OB RESULTS CONSOLE GBS: GBS: NEGATIVE

## 2019-05-19 NOTE — Discharge Instructions (Signed)

## 2019-05-19 NOTE — MAU Note (Signed)
Pt presents to MAU with c/o ctx that started last night around 10:30 pm. She reports that they are 5 min apart, denies VB and LOF. +FM

## 2019-05-20 ENCOUNTER — Inpatient Hospital Stay (HOSPITAL_BASED_OUTPATIENT_CLINIC_OR_DEPARTMENT_OTHER): Payer: Medicaid Other

## 2019-05-20 ENCOUNTER — Other Ambulatory Visit: Payer: Self-pay

## 2019-05-20 ENCOUNTER — Encounter (HOSPITAL_COMMUNITY): Payer: Self-pay

## 2019-05-20 ENCOUNTER — Inpatient Hospital Stay (HOSPITAL_COMMUNITY)
Admission: AD | Admit: 2019-05-20 | Discharge: 2019-05-20 | Disposition: A | Discharge status: Home / Self Care | Payer: Medicaid Other | Source: Ambulatory Visit | Attending: Obstetrics and Gynecology | Admitting: Obstetrics and Gynecology

## 2019-05-20 DIAGNOSIS — Z3A37 37 weeks gestation of pregnancy: Secondary | ICD-10-CM | POA: Insufficient documentation

## 2019-05-20 DIAGNOSIS — O4703 False labor before 37 completed weeks of gestation, third trimester: Secondary | ICD-10-CM | POA: Diagnosis not present

## 2019-05-20 DIAGNOSIS — O368190 Decreased fetal movements, unspecified trimester, not applicable or unspecified: Secondary | ICD-10-CM

## 2019-05-20 DIAGNOSIS — O479 False labor, unspecified: Secondary | ICD-10-CM

## 2019-05-20 DIAGNOSIS — O368130 Decreased fetal movements, third trimester, not applicable or unspecified: Secondary | ICD-10-CM

## 2019-05-20 DIAGNOSIS — Z3689 Encounter for other specified antenatal screening: Secondary | ICD-10-CM | POA: Diagnosis not present

## 2019-05-20 LAB — URINALYSIS, ROUTINE W REFLEX MICROSCOPIC
Bilirubin Urine: NEGATIVE
Glucose, UA: NEGATIVE mg/dL
Hgb urine dipstick: NEGATIVE
Ketones, ur: NEGATIVE mg/dL
Leukocytes,Ua: NEGATIVE
Nitrite: NEGATIVE
Protein, ur: NEGATIVE mg/dL
Specific Gravity, Urine: 1.01 (ref 1.005–1.030)
pH: 6.5 (ref 5.0–8.0)

## 2019-05-20 MED ORDER — CYCLOBENZAPRINE HCL 10 MG PO TABS
10.0000 mg | ORAL_TABLET | Freq: Three times a day (TID) | ORAL | Status: DC | PRN
  Administered 2019-05-20: 10 mg via ORAL
  Filled 2019-05-20: qty 1

## 2019-05-20 NOTE — MAU Note (Addendum)
Pt reports to MAU with complaints of contractions since yesterday morning, but states" they are more intense today, last reports feeling movement around 1-2 yesterday". Denies bleeding, denies fluid leaking.

## 2019-05-20 NOTE — Discharge Instructions (Signed)

## 2019-05-20 NOTE — MAU Provider Note (Signed)
History     CSN: [id number]  Arrival date and time: 05/20/19 5883   First Provider Initiated Contact with Patient 05/20/19 0105      Chief Complaint  Patient presents with  . Contractions  . Decreased Fetal Movement   23 y.o. G2P1001 '@37'$ .0 wks presenting with worsening ctx and decreased FM. Reports no FM since 1-2 pm yesterday. Reports worsening pain with ctx since 9pm. Ctx are q5 min. No VB or LOF. States she is hydrating well.   OB History    Gravida  2   Para  1   Term  1   Preterm      AB      Living  1     SAB      TAB      Ectopic      Multiple      Live Births  1           Past Medical History:  Diagnosis Date  . Medical history non-contributory     History reviewed. No pertinent surgical history.  Family History  Problem Relation Age of Onset  . Healthy Mother   . Healthy Father     Social History   Tobacco Use  . Smoking status: Never Smoker  . Smokeless tobacco: Never Used  Substance Use Topics  . Alcohol use: Not Currently    Comment: not while preg  . Drug use: No    Allergies: No Known Allergies  Medications Prior to Admission  Medication Sig Dispense Refill Last Dose  . Prenatal Vit-Fe Fumarate-FA (PRENATAL VITAMINS PO) Take by mouth.   05/19/2019 at Unknown time  . AMBULATORY NON FORMULARY MEDICATION 1 Device by Other route once a week. Blood pressure cuff/Medium  Monitored Regularly at home   ICD 10: Z34.90 LROB 1 kit 0   . cyclobenzaprine (FLEXERIL) 10 MG tablet Take 1 tablet (10 mg total) by mouth 3 (three) times daily as needed (back pain). 20 tablet 0   . Fe Fum-FePoly-FA-Vit C-Vit B3 (INTEGRA F) 125-1 MG CAPS Take 1 capsule by mouth daily. 30 capsule 1     Review of Systems  Gastrointestinal: Positive for abdominal pain (ctx).  Genitourinary: Negative for vaginal bleeding and vaginal discharge.  Musculoskeletal: Positive for back pain.   Physical Exam   Blood pressure 102/70, pulse 83, temperature 98.3 F  (36.8 C), resp. rate 16, weight 65.4 kg, last menstrual period 08/28/2018, SpO2 100 %.  Physical Exam  Nursing note and vitals reviewed. Constitutional: She is oriented to person, place, and time. She appears well-developed. No distress.  Neck: Normal range of motion.  Cardiovascular: Normal rate.  Respiratory: Effort normal.  GI: Soft. She exhibits no distension. There is no abdominal tenderness.  Genitourinary:    Genitourinary Comments: VE: 1/60/-2, vtx   Musculoskeletal: Normal range of motion.  Neurological: She is alert and oriented to person, place, and time.  Skin: Skin is warm and dry.  Psychiatric: She has a normal mood and affect.  EFM: 135 bpm, mod variability, + accels, no decels Toco: 2-5  Results for orders placed or performed during the hospital encounter of 05/20/19 (from the past 24 hour(s))  Urinalysis, Routine w reflex microscopic     Status: None   Collection Time: 05/20/19  1:02 AM  Result Value Ref Range   Color, Urine YELLOW YELLOW   APPearance CLEAR CLEAR   Specific Gravity, Urine 1.010 1.005 - 1.030   pH 6.5 5.0 - 8.0   Glucose, UA  NEGATIVE NEGATIVE mg/dL   Hgb urine dipstick NEGATIVE NEGATIVE   Bilirubin Urine NEGATIVE NEGATIVE   Ketones, ur NEGATIVE NEGATIVE mg/dL   Protein, ur NEGATIVE NEGATIVE mg/dL   Nitrite NEGATIVE NEGATIVE   Leukocytes,Ua NEGATIVE NEGATIVE   MAU Course  Procedures Meds ordered this encounter  Medications  . cyclobenzaprine (FLEXERIL) tablet 10 mg   MDM Pt continues to reports no FM since EFM applied. BPP ordered. BPP 8/8, AFI 9 cm. Continues to feel ctx, cervix unchanged. Pt reports FM since [place]. Stable for discharge home.   Assessment and Plan   1. [redacted] weeks gestation of pregnancy   2. Decreased fetal movement   3. NST (non-stress test) reactive   4. Braxton Hicks contractions    Discharge home Follow up at [facility] as scheduled Labor precautions FMCs Hydrate Tubs soaks prn comfort  Allergies as of 05/20/2019    No Known Allergies     Medication List    TAKE these medications   AMBULATORY NON FORMULARY MEDICATION 1 Device by Other route once a week. Blood pressure cuff/Medium  Monitored Regularly at home   ICD 10: Z34.90 LROB   cyclobenzaprine 10 MG tablet Commonly known as: FLEXERIL Take 1 tablet (10 mg total) by mouth 3 (three) times daily as needed (back pain).   Integra F 125-1 MG Caps Take 1 capsule by mouth daily.   PRENATAL VITAMINS PO Take by mouth.       Julianne Handler, CNM 05/20/2019, 3:03 AM

## 2019-05-20 NOTE — Progress Notes (Signed)
Gave lemoade

## 2019-05-21 LAB — CULTURE, BETA STREP (GROUP B ONLY)

## 2019-05-22 ENCOUNTER — Encounter: Payer: Medicaid Other | Admitting: Family Medicine

## 2019-06-03 ENCOUNTER — Encounter: Payer: Medicaid Other | Admitting: Advanced Practice Midwife

## 2019-06-05 ENCOUNTER — Ambulatory Visit (INDEPENDENT_AMBULATORY_CARE_PROVIDER_SITE_OTHER): Payer: Medicaid Other | Admitting: Family Medicine

## 2019-06-05 VITALS — BP 99/72 | HR 87 | Wt 148.1 lb

## 2019-06-05 DIAGNOSIS — O368130 Decreased fetal movements, third trimester, not applicable or unspecified: Secondary | ICD-10-CM | POA: Diagnosis not present

## 2019-06-05 DIAGNOSIS — Z3480 Encounter for supervision of other normal pregnancy, unspecified trimester: Secondary | ICD-10-CM

## 2019-06-05 DIAGNOSIS — Z3A39 39 weeks gestation of pregnancy: Secondary | ICD-10-CM

## 2019-06-05 NOTE — Progress Notes (Signed)
   PRENATAL VISIT NOTE  Subjective:  Lauren Hanna is a 23 y.o. G2P1001 at [redacted]w[redacted]d being seen today for ongoing prenatal care.  She is currently monitored for the following issues for this low-risk pregnancy and has Supervision of other normal pregnancy, antepartum; Open fracture of nasal bone with routine healing; Anemia, antepartum; Late prenatal care affecting pregnancy; and False labor before 37 completed weeks of gestation on their problem list.  Patient reports decreased fetal movement, but started to feel baby move on NST.  Contractions: Irregular. Vag. Bleeding: None.  Movement: (!) Decreased. Denies leaking of fluid.   The following portions of the patient's history were reviewed and updated as appropriate: allergies, current medications, past family history, past medical history, past social history, past surgical history and problem list.   Objective:   Vitals:   06/05/19 1137  BP: 99/72  Pulse: 87  Weight: 148 lb 1.9 oz (67.2 kg)    Fetal Status:     Movement: (!) Decreased     General:  Alert, oriented and cooperative. Patient is in no acute distress.  Skin: Skin is warm and dry. No rash noted.   Cardiovascular: Normal heart rate noted  Respiratory: Normal respiratory effort, no problems with respiration noted  Abdomen: Soft, gravid, appropriate for gestational age.  Pain/Pressure: Present     Pelvic: Cervical exam deferred        Extremities: Normal range of motion.  Edema: Trace  Mental Status: Normal mood and affect. Normal behavior. Normal judgment and thought content.   Assessment and Plan:  Pregnancy: G2P1001 at [redacted]w[redacted]d  1. Supervision of other normal pregnancy, antepartum Patient scheduled for elective induction  2. Decreased fetal movements in third trimester, single or unspecified fetus NST reactive   Preterm labor symptoms and general obstetric precautions including but not limited to vaginal bleeding, contractions, leaking of fluid and fetal movement  were reviewed in detail with the patient. Please refer to After Visit Summary for other counseling recommendations.   No follow-ups on file.  Future Appointments  Date Time Provider [place]  07/21/2019 10:00 AM Lavonia Drafts, MD [facility] None    Truett Mainland, DO

## 2019-06-06 ENCOUNTER — Telehealth (HOSPITAL_COMMUNITY): Payer: Self-pay | Admitting: *Deleted

## 2019-06-06 ENCOUNTER — Encounter (HOSPITAL_COMMUNITY): Payer: Self-pay | Admitting: *Deleted

## 2019-06-06 NOTE — Telephone Encounter (Signed)
Preadmission screen  

## 2019-06-08 ENCOUNTER — Other Ambulatory Visit (HOSPITAL_COMMUNITY)
Admission: RE | Admit: 2019-06-08 | Discharge: 2019-06-08 | Disposition: A | Discharge status: Home / Self Care | Payer: Medicaid Other | Source: Ambulatory Visit | Attending: Obstetrics & Gynecology | Admitting: Obstetrics & Gynecology

## 2019-06-08 ENCOUNTER — Other Ambulatory Visit: Payer: Self-pay

## 2019-06-08 DIAGNOSIS — Z20828 Contact with and (suspected) exposure to other viral communicable diseases: Secondary | ICD-10-CM | POA: Diagnosis not present

## 2019-06-08 DIAGNOSIS — Z01812 Encounter for preprocedural laboratory examination: Secondary | ICD-10-CM | POA: Insufficient documentation

## 2019-06-08 LAB — SARS CORONAVIRUS 2 (TAT 6-24 HRS): SARS Coronavirus 2: NEGATIVE

## 2019-06-08 NOTE — Progress Notes (Signed)
Patient here for pre-admission COVID test. Denies symptoms. Swab collected. 

## 2019-06-10 ENCOUNTER — Inpatient Hospital Stay (HOSPITAL_COMMUNITY)
Admission: AD | Admit: 2019-06-10 | Discharge: 2019-06-12 | DRG: 807 | Disposition: A | Discharge status: Home / Self Care | Payer: Medicaid Other | Attending: Family Medicine | Admitting: Family Medicine

## 2019-06-10 ENCOUNTER — Encounter (HOSPITAL_COMMUNITY): Payer: Self-pay

## 2019-06-10 ENCOUNTER — Other Ambulatory Visit: Payer: Self-pay

## 2019-06-10 ENCOUNTER — Inpatient Hospital Stay (HOSPITAL_COMMUNITY): Payer: Medicaid Other

## 2019-06-10 DIAGNOSIS — Z30430 Encounter for insertion of intrauterine contraceptive device: Secondary | ICD-10-CM

## 2019-06-10 DIAGNOSIS — O9902 Anemia complicating childbirth: Principal | ICD-10-CM | POA: Diagnosis present

## 2019-06-10 DIAGNOSIS — O99019 Anemia complicating pregnancy, unspecified trimester: Secondary | ICD-10-CM

## 2019-06-10 DIAGNOSIS — Z3A40 40 weeks gestation of pregnancy: Secondary | ICD-10-CM

## 2019-06-10 DIAGNOSIS — O0933 Supervision of pregnancy with insufficient antenatal care, third trimester: Secondary | ICD-10-CM

## 2019-06-10 DIAGNOSIS — Z3490 Encounter for supervision of normal pregnancy, unspecified, unspecified trimester: Secondary | ICD-10-CM | POA: Diagnosis present

## 2019-06-10 DIAGNOSIS — D649 Anemia, unspecified: Secondary | ICD-10-CM | POA: Diagnosis present

## 2019-06-10 DIAGNOSIS — Z23 Encounter for immunization: Secondary | ICD-10-CM | POA: Diagnosis not present

## 2019-06-10 DIAGNOSIS — O09899 Supervision of other high risk pregnancies, unspecified trimester: Secondary | ICD-10-CM | POA: Insufficient documentation

## 2019-06-10 DIAGNOSIS — Z3480 Encounter for supervision of other normal pregnancy, unspecified trimester: Secondary | ICD-10-CM

## 2019-06-10 DIAGNOSIS — O26893 Other specified pregnancy related conditions, third trimester: Secondary | ICD-10-CM | POA: Diagnosis present

## 2019-06-10 LAB — CBC
HCT: 30.3 % — ABNORMAL LOW (ref 36.0–46.0)
Hemoglobin: 10.2 g/dL — ABNORMAL LOW (ref 12.0–15.0)
MCH: 29.4 pg (ref 26.0–34.0)
MCHC: 33.7 g/dL (ref 30.0–36.0)
MCV: 87.3 fL (ref 80.0–100.0)
Platelets: 248 10*3/uL (ref 150–400)
RBC: 3.47 MIL/uL — ABNORMAL LOW (ref 3.87–5.11)
RDW: 12.9 % (ref 11.5–15.5)
WBC: 7.9 10*3/uL (ref 4.0–10.5)
nRBC: 0 % (ref 0.0–0.2)

## 2019-06-10 LAB — TYPE AND SCREEN
ABO/RH(D): O POS
Antibody Screen: NEGATIVE

## 2019-06-10 LAB — HEMOGLOBIN A1C
Hgb A1c MFr Bld: 5.4 % (ref 4.8–5.6)
Mean Plasma Glucose: 108.28 mg/dL

## 2019-06-10 LAB — ABO/RH: ABO/RH(D): O POS

## 2019-06-10 MED ORDER — OXYCODONE-ACETAMINOPHEN 5-325 MG PO TABS
1.0000 | ORAL_TABLET | ORAL | Status: DC | PRN
  Administered 2019-06-11: 1 via ORAL
  Filled 2019-06-10: qty 1

## 2019-06-10 MED ORDER — TERBUTALINE SULFATE 1 MG/ML IJ SOLN: 0.2500 mg | Freq: Once | INTRAMUSCULAR | Status: DC | PRN

## 2019-06-10 MED ORDER — LACTATED RINGERS IV SOLN
500.0000 mL | INTRAVENOUS | Status: DC | PRN
  Administered 2019-06-10: 500 mL via INTRAVENOUS

## 2019-06-10 MED ORDER — OXYTOCIN BOLUS FROM INFUSION
500.0000 mL | Freq: Once | INTRAVENOUS | Status: AC
  Administered 2019-06-10: 500 mL via INTRAVENOUS

## 2019-06-10 MED ORDER — LEVONORGESTREL 19.5 MCG/DAY IU IUD
INTRAUTERINE_SYSTEM | Freq: Once | INTRAUTERINE | Status: AC
  Administered 2019-06-10: 1 via INTRAUTERINE
  Filled 2019-06-10: qty 1

## 2019-06-10 MED ORDER — LACTATED RINGERS IV SOLN
INTRAVENOUS | Status: DC
  Administered 2019-06-10 (×2): via INTRAVENOUS

## 2019-06-10 MED ORDER — LIDOCAINE HCL (PF) 1 % IJ SOLN
30.0000 mL | INTRAMUSCULAR | Status: DC | PRN
  Filled 2019-06-10: qty 30

## 2019-06-10 MED ORDER — OXYTOCIN 40 UNITS IN NORMAL SALINE INFUSION - SIMPLE MED: 1.0000 m[IU]/min | INTRAVENOUS | Status: DC

## 2019-06-10 MED ORDER — OXYCODONE-ACETAMINOPHEN 5-325 MG PO TABS: 2.0000 | ORAL_TABLET | ORAL | Status: DC | PRN

## 2019-06-10 MED ORDER — FENTANYL CITRATE (PF) 100 MCG/2ML IJ SOLN
INTRAMUSCULAR | Status: AC
  Administered 2019-06-10: 100 ug
  Filled 2019-06-10: qty 2

## 2019-06-10 MED ORDER — FENTANYL CITRATE (PF) 100 MCG/2ML IJ SOLN: 100.0000 ug | INTRAMUSCULAR | Status: DC | PRN

## 2019-06-10 MED ORDER — ONDANSETRON HCL 4 MG/2ML IJ SOLN
4.0000 mg | Freq: Four times a day (QID) | INTRAMUSCULAR | Status: DC | PRN
  Administered 2019-06-10: 4 mg via INTRAVENOUS
  Filled 2019-06-10: qty 2

## 2019-06-10 MED ORDER — ACETAMINOPHEN 325 MG PO TABS
650.0000 mg | ORAL_TABLET | ORAL | Status: DC | PRN
  Administered 2019-06-10: 23:00:00 650 mg via ORAL
  Filled 2019-06-10: qty 2

## 2019-06-10 MED ORDER — MISOPROSTOL 50MCG HALF TABLET
50.0000 ug | ORAL_TABLET | ORAL | Status: DC | PRN
  Administered 2019-06-10: 50 ug via ORAL
  Filled 2019-06-10: qty 1

## 2019-06-10 MED ORDER — OXYTOCIN 40 UNITS IN NORMAL SALINE INFUSION - SIMPLE MED
1.0000 m[IU]/min | INTRAVENOUS | Status: DC
  Administered 2019-06-10: 2 m[IU]/min via INTRAVENOUS

## 2019-06-10 MED ORDER — SOD CITRATE-CITRIC ACID 500-334 MG/5ML PO SOLN: 30.0000 mL | ORAL | Status: DC | PRN

## 2019-06-10 MED ORDER — OXYTOCIN 40 UNITS IN NORMAL SALINE INFUSION - SIMPLE MED
2.5000 [IU]/h | INTRAVENOUS | Status: DC
  Filled 2019-06-10: qty 1000

## 2019-06-10 NOTE — Progress Notes (Signed)
Labor Progress Note Candis Kabel is a 23 y.o. G2P1001 at [redacted]w[redacted]d presented for elective IOL.   S:  Starting to feel ctx, some painful. Denies need for pain meds.   O:  BP 111/81   Pulse 86   Temp 98.4 F (36.9 C) (Oral)   Resp 18   LMP 08/28/2018 (Exact Date)  EFM: baseline 140 bpm/ mod variability/ + accels/ no decels  Toco/IUPC: 1-4 SVE: deferred  A/P: 23 y.o. G2P1001 [redacted]w[redacted]d  1. Labor: latent 2. FWB: Cat I 3. Pain: analgesia/anesthesia prn  S/p Cytotec x1. Continue ripening. FB when able. Anticipate labor progression and SVD.  Julianne Handler, CNM 2:57 PM

## 2019-06-10 NOTE — Progress Notes (Signed)
Patient ID: Lauren Hanna, female   DOB: [birth year], 23 y.o.   MRN: [id number] Lauren Hanna is a 23 y.o. G2P1001 at [redacted]w[redacted]d admitted for induction of labor due to Elective at term.  Subjective: Uncomfortable w/ contractions, doesn't want epidural  Objective: BP 106/66   Pulse 85   Temp 98.8 F (37.1 C) (Oral)   Resp 17   Ht 5\' 6"  (1.676 m)   Wt 68 kg   LMP 08/28/2018 (Exact Date)   BMI 24.20 kg/m  No intake/output data recorded.  FHT:  FHR: 125 bpm, variability: moderate,  accelerations:  Present,  decelerations:  Absent UC:   q 1-9mins  SVE:   Dilation: 3 Effacement (%): 60 Station: -3 Exam by:: Lenise Herald RN  Labs: Lab Results  Component Value Date   WBC 7.9 06/10/2019   HGB 10.2 (L) 06/10/2019   HCT 30.3 (L) 06/10/2019   MCV 87.3 06/10/2019   PLT 248 06/10/2019    Assessment / Plan: IOL d/t elective at term, s/p cytotec x 1, contracting on her own now, hasn't made change since last SVE, will start pitocin per protocol  Labor: early Fetal Wellbeing:  Category I Pain Control:  Labor support without medications Pre-eclampsia: n/a I/D:  neg Anticipated MOD:  NSVD  Roma Schanz CNM, WHNP-BC 06/10/2019, 2100

## 2019-06-10 NOTE — Discharge Summary (Addendum)
Postpartum Discharge Summary  Date of Service updated 06/12/2019     Patient Name: Lauren Hanna DOB: 17-Jul-1996 MRN: [id number]  Date of admission: 06/10/2019 Delivering Provider: Richarda Osmond   Date of discharge: 06/12/2019  Admitting diagnosis: PREG Intrauterine pregnancy: [redacted]w[redacted]d    Secondary diagnosis:  Active Problems:   Encounter for induction of labor   Encounter for IUD insertion  Additional problems: none     Discharge diagnosis: Term Pregnancy Delivered, postplacental Liletta placed                                                                                           Post partum procedures:IUD  Augmentation: Pitocin and Cytotec  Complications: None  Hospital course:  Induction of Labor With Vaginal Delivery   23y.o. yo G2P1001 at 465w0das admitted to the hospital 06/10/2019 for induction of labor.  Indication for induction: elective/term.  Patient had an uncomplicated labor course as follows: Membrane Rupture Time/Date: 10:32 PM ,06/10/2019   Intrapartum Procedures: Episiotomy: None [1]                                         Lacerations:  None [1]  Patient had delivery of a Viable infant.  Information for the patient's newborn:  WiJenan, Ellegood0[[id number]]Delivery Method: (filed from delivery)    06/10/2019  Details of delivery can be found in separate delivery note.  Patient had a routine postpartum course. Patient is discharged home 06/12/19. Delivery time: 10:58 PM    Magnesium Sulfate received: No BMZ received: No Rhophylac:N/A MMR:N/A Transfusion:No  Physical exam  Vitals:   06/11/19 1003 06/11/19 1430 06/11/19 2211 06/12/19 0504  BP: 116/78 103/68 120/77 99/69  Pulse: 72 63 74 65  Resp: _0 Temp: 98 F (36.7 C) 98.1 F (36.7 C) 98.6 F (37 C) 97.8 F (36.6 C)  TempSrc: Oral Oral    SpO2: 98% 100% 98% 100%  Weight:      Height:       General: alert, cooperative and no distress Lochia: appropriate  Uterine Fundus: firm DVT Evaluation: No evidence of DVT seen on physical exam. Negative Homan's sign. No cords or calf tenderness. No significant calf/ankle edema. Labs: Lab Results  Component Value Date   WBC 7.9 06/10/2019   HGB 10.2 (L) 06/10/2019   HCT 30.3 (L) 06/10/2019   MCV 87.3 06/10/2019   PLT 248 06/10/2019   CMP Latest Ref Rng & Units 02/04/2016  Glucose 65 - 99 mg/dL 97  BUN 6 - 20 mg/dL 8  Creatinine 0.44 - 1.00 mg/dL 0.74  Sodium 135 - 145 mmol/L 138  Potassium 3.5 - 5.1 mmol/L 3.8  Chloride 101 - 111 mmol/L 105  CO2 22 - 32 mmol/L 27  Calcium 8.9 - 10.3 mg/dL 9.3  Total Protein 6.5 - 8.1 g/dL 8.3(H)  Total Bilirubin 0.3 - 1.2 mg/dL 0.5  Alkaline Phos 38 - 126 U/L 47  AST 15 - 41 U/L 25  ALT 14 - 54 U/L  15    Discharge instruction: per After Visit Summary and "Baby and Me Booklet".  After visit meds:  Allergies as of 06/12/2019   No Known Allergies     Medication List    STOP taking these medications   acetaminophen 500 MG tablet Commonly known as: TYLENOL   PRENATAL VITAMINS PO     TAKE these medications   AMBULATORY NON FORMULARY MEDICATION 1 Device by Other route once a week. Blood pressure cuff/Medium  Monitored Regularly at home   ICD 10: Z34.90 LROB   benzocaine-Menthol 20-0.5 % Aero Commonly known as: DERMOPLAST Apply 1 application topically as needed for irritation (perineal discomfort).   cyclobenzaprine 10 MG tablet Commonly known as: FLEXERIL Take 1 tablet (10 mg total) by mouth 3 (three) times daily as needed (back pain).   ibuprofen 600 MG tablet Commonly known as: ADVIL Take 1 tablet (600 mg total) by mouth every 6 (six) hours.   Integra F 125-1 MG Caps Take 1 capsule by mouth daily.   senna-docusate 8.6-50 MG tablet Commonly known as: Senokot-S Take 2 tablets by mouth daily. Start taking on: June 13, 2019       Diet: routine diet  Activity: Advance as tolerated. Pelvic rest for 6 weeks.   Outpatient  follow up:4 weeks Follow up Appt: Future Appointments  Date Time Provider [place]  07/21/2019 10:00 AM Lavonia Drafts, MD [facility] None   Follow up Visit: [place]. Go on 07/21/2019.   Specialty: Obstetrics and Gynecology Why: Postpartum visit at 10 am with Dr. Evans Lance information: [place] [phone]         Roma Schanz, [place]  P [facility] Admin        Please schedule this patient for PP visit in: 4 weeks  Low risk pregnancy complicated by: none  Delivery mode: SVD  Postplacental Liletta inserted 06/10/19  PP Procedures needed: IUD check  Schedule Integrated [place] visit: no  Provider: Any provider    Newborn Data: Live born female  Birth Weight: 3360g  APGAR: 1, 9  Newborn Delivery   Birth date/time: 06/10/2019 22:58:00 Delivery type: Vaginal, Spontaneous      Baby Feeding: Breast Disposition:rooming in (infant on phototherapy)    06/12/2019 Carollee Leitz, MD  OB [place]  I have seen and examined this patient and agree with above documentation in the resident's note.   Phill Myron, D.O. OB Fellow  06/12/2019, 12:28 PM

## 2019-06-10 NOTE — Progress Notes (Signed)
Labor Progress Note Lauren Hanna is a 23 y.o. G2P1001 at [redacted]w[redacted]d presented for elective IOL  S:  Feeling more painful ctx, using birth ball.  O:  BP (!) 111/52   Pulse (!) 117   Temp 98.4 F (36.9 C) (Oral)   Resp 18   LMP 08/28/2018 (Exact Date)  EFM: baseline 145 bpm/ mod variability/ no accels/ no decels  Toco/IUPC: 1-3 SVE: Dilation: 3 Effacement (%): 70 Cervical Position: Posterior Station: Ballotable Presentation: Vertex Exam by:: Colman Cater CNM Membrane sweep performed  A/P: 23 y.o. G2P1001 [redacted]w[redacted]d  1. Labor: latent 2. FWB: Cat I 3. Pain: analgesia/anesthesia prn  S/p Cytotec x1 with good cervical change and ctx pattern. Anticipate labor progression and SVD.  Julianne Handler, CNM 5:02 PM

## 2019-06-10 NOTE — H&P (Signed)
OBSTETRIC ADMISSION HISTORY AND PHYSICAL  Lauren Hanna is a 23 y.o. female G2P1001 with IUP at 42w0dpresenting for elective IOL. She reports +FMs. No LOF, VB, blurry vision, headaches, peripheral edema, or RUQ pain. She plans on breastfeeding. She requests post-placental IUD for birth control.  Dating: By LMP --->  Estimated Date of Delivery: 06/10/19  Sono:    '@[redacted]w[redacted]d'$ , normal anatomy, ceph presentation, 1602g, 25%ile, EFW 3'9   Prenatal History/Complications: - short pregnancy interval - anemia - late prenatal care  Past Medical History: Past Medical History:  Diagnosis Date  . Medical history non-contributory     Past Surgical History: History reviewed. No pertinent surgical history.  Obstetrical History: OB History    Gravida  2   Para  1   Term  1   Preterm      AB      Living  1     SAB      TAB      Ectopic      Multiple      Live Births  1           Social History: Social History   Socioeconomic History  . Marital status: Single    Spouse name: Not on file  . Number of children: Not on file  . Years of education: Not on file  . Highest education level: Not on file  Occupational History  . Not on file  Social Needs  . Financial resource strain: Not on file  . Food insecurity    Worry: Not on file    Inability: Not on file  . Transportation needs    Medical: Not on file    Non-medical: Not on file  Tobacco Use  . Smoking status: Never Smoker  . Smokeless tobacco: Never Used  Substance and Sexual Activity  . Alcohol use: Not Currently    Comment: not while preg  . Drug use: No  . Sexual activity: Yes    Birth control/protection: None    Comment: last sex past month  Lifestyle  . Physical activity    Days per week: Not on file    Minutes per session: Not on file  . Stress: Not on file  Relationships  . Social [place] phone: Not on file    Gets together: Not on file    Attends religious service: Not  on file    Active member of club or organization: Not on file    Attends meetings of clubs or organizations: Not on file    Relationship status: Not on file  Other Topics Concern  . Not on file  Social History Narrative  . Not on file    Family History: Family History  Problem Relation Age of Onset  . Healthy Mother   . Healthy Father     Allergies: No Known Allergies  Medications Prior to Admission  Medication Sig Dispense Refill Last Dose  . Prenatal Vit-Fe Fumarate-FA (PRENATAL VITAMINS PO) Take by mouth.   06/09/2019 at Unknown time  . AMBULATORY NON FORMULARY MEDICATION 1 Device by Other route once a week. Blood pressure cuff/Medium  Monitored Regularly at home   ICD 10: Z34.90 LROB (Patient not taking: Reported on 06/05/2019) 1 kit 0   . cyclobenzaprine (FLEXERIL) 10 MG tablet Take 1 tablet (10 mg total) by mouth 3 (three) times daily as needed (back pain). (Patient not taking: Reported on 06/05/2019) 20 tablet 0   . Fe Fum-FePoly-FA-Vit C-Vit B3 (  INTEGRA F) 125-1 MG CAPS Take 1 capsule by mouth daily. 30 capsule 1      Review of Systems:  All systems reviewed and negative except as stated in HPI  PE: Blood pressure 112/76, pulse 89, temperature 98.4 F (36.9 C), temperature source Oral, resp. rate 18, last menstrual period 08/28/2018. General appearance: alert, cooperative and no distress Lungs: regular rate and effort Heart: regular rate  Abdomen: soft, non-tender Extremities: Homans sign is negative, no sign of DVT Presentation: cephalic- confirmed by bedside US EFM: 130 bpm, mod variability, + accels, no decels Toco: rare Dilation: Fingertip Effacement (%): Thick Exam by:: Leanore Biggers CNM  Prenatal labs: ABO, Rh: --/--/PENDING (11/17 1150) Antibody: PENDING (11/17 1150) Rubella: 4.13 (08/04 0920) RPR: Non Reactive (08/04 0920)  HBsAg: Negative (08/04 0920)  HIV: Non Reactive (08/04 0920)  GBS: Negative/-- (10/26 0000)  2 hr GTT not completed  Prenatal  Transfer Tool  Maternal Diabetes: test not completed Genetic Screening: Normal Maternal Ultrasounds/Referrals: Normal Fetal Ultrasounds or other Referrals:  None Maternal Substance Abuse:  No Significant Maternal Medications:  None Significant Maternal Lab Results: None  Results for orders placed or performed during the hospital encounter of 06/10/19 (from the past 24 hour(s))  CBC   Collection Time: 06/10/19 11:50 AM  Result Value Ref Range   WBC 7.9 4.0 - 10.5 K/uL   RBC 3.47 (L) 3.87 - 5.11 MIL/uL   Hemoglobin 10.2 (L) 12.0 - 15.0 g/dL   HCT 30.3 (L) 36.0 - 46.0 %   MCV 87.3 80.0 - 100.0 fL   MCH 29.4 26.0 - 34.0 pg   MCHC 33.7 30.0 - 36.0 g/dL   RDW 12.9 11.5 - 15.5 %   Platelets 248 150 - 400 K/uL   nRBC 0.0 0.0 - 0.2 %  Type and screen   Collection Time: 06/10/19 11:50 AM  Result Value Ref Range   ABO/RH(D) PENDING    Antibody Screen PENDING    Sample Expiration      06/13/2019,2359 Performed at [facility], [address] [place], [place] [address]     Patient Active Problem List   Diagnosis Date Noted  . Encounter for induction of labor 06/10/2019  . False labor before 37 completed weeks of gestation 05/19/2019  . Anemia, antepartum 03/28/2019  . Late prenatal care affecting pregnancy 03/28/2019  . Supervision of other normal pregnancy, antepartum 02/25/2019  . Open fracture of nasal bone with routine healing 06/26/2016    Assessment: Eiliyah Reh is a 23 y.o. G2P1001 at 47w0dhere for elective IOL  1. Labor: latent 2. FWB: Cat I 3. Pain: analgesia/anesthesia prn 4. GBS: neg   Plan: Admit to LD Discussed IOL is elective as no medical indication identified. IOL can increase risk of complications such as need for CS. IOL may take an extended amt of time with unfavorable cervix. She accepts risks and wishes to proceed.  Cytotec for ripening, FB when able Post-placental IUD Anticipate SVD  MJulianne Handler CNM  06/10/2019, 12:49  PM

## 2019-06-11 ENCOUNTER — Encounter (HOSPITAL_COMMUNITY): Payer: Self-pay | Admitting: *Deleted

## 2019-06-11 DIAGNOSIS — Z30430 Encounter for insertion of intrauterine contraceptive device: Secondary | ICD-10-CM

## 2019-06-11 LAB — RPR: RPR Ser Ql: NONREACTIVE

## 2019-06-11 MED ORDER — TETANUS-DIPHTH-ACELL PERTUSSIS 5-2.5-18.5 LF-MCG/0.5 IM SUSP: 0.5000 mL | Freq: Once | INTRAMUSCULAR | Status: DC

## 2019-06-11 MED ORDER — ONDANSETRON HCL 4 MG PO TABS: 4.0000 mg | ORAL_TABLET | ORAL | Status: DC | PRN

## 2019-06-11 MED ORDER — PRENATAL MULTIVITAMIN CH
1.0000 | ORAL_TABLET | Freq: Every day | ORAL | Status: DC
  Administered 2019-06-11 – 2019-06-12 (×2): 1 via ORAL
  Filled 2019-06-11 (×2): qty 1

## 2019-06-11 MED ORDER — WITCH HAZEL-GLYCERIN EX PADS: 1.0000 "application " | MEDICATED_PAD | CUTANEOUS | Status: DC | PRN

## 2019-06-11 MED ORDER — ACETAMINOPHEN 325 MG PO TABS
650.0000 mg | ORAL_TABLET | ORAL | Status: DC | PRN
  Administered 2019-06-11 – 2019-06-12 (×5): 650 mg via ORAL
  Filled 2019-06-11 (×5): qty 2

## 2019-06-11 MED ORDER — BENZOCAINE-MENTHOL 20-0.5 % EX AERO: 1.0000 "application " | INHALATION_SPRAY | CUTANEOUS | Status: DC | PRN

## 2019-06-11 MED ORDER — SIMETHICONE 80 MG PO CHEW: 80.0000 mg | CHEWABLE_TABLET | ORAL | Status: DC | PRN

## 2019-06-11 MED ORDER — COCONUT OIL OIL: 1.0000 "application " | TOPICAL_OIL | Status: DC | PRN

## 2019-06-11 MED ORDER — ONDANSETRON HCL 4 MG/2ML IJ SOLN: 4.0000 mg | INTRAMUSCULAR | Status: DC | PRN

## 2019-06-11 MED ORDER — ZOLPIDEM TARTRATE 5 MG PO TABS: 5.0000 mg | ORAL_TABLET | Freq: Every evening | ORAL | Status: DC | PRN

## 2019-06-11 MED ORDER — DIBUCAINE (PERIANAL) 1 % EX OINT: 1.0000 "application " | TOPICAL_OINTMENT | CUTANEOUS | Status: DC | PRN

## 2019-06-11 MED ORDER — DIPHENHYDRAMINE HCL 25 MG PO CAPS: 25.0000 mg | ORAL_CAPSULE | Freq: Four times a day (QID) | ORAL | Status: DC | PRN

## 2019-06-11 MED ORDER — IBUPROFEN 600 MG PO TABS
600.0000 mg | ORAL_TABLET | Freq: Four times a day (QID) | ORAL | Status: DC
  Administered 2019-06-11 – 2019-06-12 (×7): 600 mg via ORAL
  Filled 2019-06-11 (×7): qty 1

## 2019-06-11 MED ORDER — SENNOSIDES-DOCUSATE SODIUM 8.6-50 MG PO TABS
2.0000 | ORAL_TABLET | ORAL | Status: DC
  Administered 2019-06-12: 2 via ORAL
  Filled 2019-06-11: qty 2

## 2019-06-11 MED ORDER — INFLUENZA VAC SPLIT QUAD 0.5 ML IM SUSY
0.5000 mL | PREFILLED_SYRINGE | INTRAMUSCULAR | Status: AC
  Administered 2019-06-12: 0.5 mL via INTRAMUSCULAR
  Filled 2019-06-11: qty 0.5

## 2019-06-11 NOTE — Progress Notes (Addendum)
Post Partum Day 1 Subjective: up ad lib, voiding, tolerating PO and + flatus  Objective: Blood pressure 109/79, pulse 76, temperature 98.2 F (36.8 C), temperature source Oral, resp. rate 18, height 5\' 6"  (1.676 m), weight 68 kg, last menstrual period 08/28/2018, SpO2 99 %, unknown if currently breastfeeding.  Physical Exam:  General: alert, cooperative, appears stated age and no distress Lochia: appropriate Uterine Fundus: firm DVT Evaluation: No evidence of DVT seen on physical exam. No significant calf/ankle edema.  Recent Labs    06/10/19 1150  HGB 10.2*  HCT 30.3*    Assessment/Plan: Plan for discharge tomorrow and Breastfeeding  Contraception IUD placed yesterday at time of delivery      LOS: 1 day   Lauren Hanna 06/11/2019, 10:02 AM   OB FELLOW POSTPARTUM PROGRESS NOTE ATTESTATION  I have seen and examined this patient and agree with above documentation in the student's note.   Phill Myron, D.O. OB Fellow  06/11/2019, 11:45 AM

## 2019-06-11 NOTE — Procedures (Signed)
Post-Placental IUD Insertion Procedure Note  Patient identified, informed consent signed prior to delivery, signed copy in chart, time out was performed.    Vaginal, labial and perineal areas thoroughly inspected for lacerations, none identified.   LILETTA - IUD grasped between sterile gloved fingers. Sterile lubrication applied to sterile gloved hand for ease of insertion. Fundus identified through abdominal wall using non-insertion hand. IUD inserted to fundus with bimanual technique. IUD carefully released at the fundus and insertion hand gently removed from vagina.    Strings trimmed to the level of the introitus. Patient tolerated procedure well.  Patient given post procedure instructions and IUD care card with expiration date.  Patient is asked to keep IUD strings tucked in her vagina until her postpartum follow up visit in 4-6 weeks. Patient advised to abstain from sexual intercourse and pulling on strings before her follow-up visit. Patient verbalized an understanding of the plan of care and agrees.    Roma Schanz, CNM, [facility] 06/10/2019 2330

## 2019-06-11 NOTE — Lactation Note (Signed)
This note was copied from a baby's chart. Lactation Consultation Note  Patient Name: Lauren Hanna DCVUD'T Date: 06/11/2019 Reason for consult: Initial assessment;Hyperbilirubinemia Baby is 83 hours old and receiving phototherapy.  Mom's feeding choice on admission was breastfeeding and formula.  Baby has not been to breast and only formula fed.  She states she now chooses to formula feed.  Discussed with mom.  She does have a pump at home but not sure if she will pump.  Discussed importance of initiating pumping soon if she is interested in lactating.  Mom will think about it and let us know if she would like to pump.  Maternal Data    Feeding Feeding Type: Bottle Fed - Formula Nipple Type: Slow - flow  LATCH Score                   Interventions    Lactation Tools Discussed/Used     Consult Status Consult Status: PRN    Ave Filter 06/11/2019, 10:56 AM

## 2019-06-12 MED ORDER — SENNOSIDES-DOCUSATE SODIUM 8.6-50 MG PO TABS: 2.0000 | ORAL_TABLET | ORAL | 0 refills | Status: DC

## 2019-06-12 MED ORDER — BENZOCAINE-MENTHOL 20-0.5 % EX AERO: 1.0000 "application " | INHALATION_SPRAY | CUTANEOUS | 0 refills | Status: DC | PRN

## 2019-06-12 MED ORDER — IBUPROFEN 600 MG PO TABS: 600.0000 mg | ORAL_TABLET | Freq: Four times a day (QID) | ORAL | 0 refills | Status: DC

## 2019-06-12 NOTE — Lactation Note (Signed)
This note was copied from a baby's chart. Lactation Consultation Note  Patient Name: Lauren Hanna IPPGF'Q Date: 06/12/2019 Reason for consult: Follow-up assessment  F/U with mom in regards to her decision about pumping and/or breastfeeding. Mom states she has decided to exclusively formula feed. Encouraged mom to wear and tight bra and decrease stimulation to her breasts for two weeks. Mom verbalized understanding.  Consult Status Consult Status: Complete    Cranston Neighbor 06/12/2019, 10:07 AM

## 2019-06-12 NOTE — Discharge Instructions (Signed)

## 2019-07-03 ENCOUNTER — Other Ambulatory Visit: Payer: Self-pay

## 2019-07-03 ENCOUNTER — Encounter: Payer: Self-pay | Admitting: Obstetrics & Gynecology

## 2019-07-03 ENCOUNTER — Ambulatory Visit (INDEPENDENT_AMBULATORY_CARE_PROVIDER_SITE_OTHER): Payer: Medicaid Other | Admitting: Obstetrics & Gynecology

## 2019-07-03 DIAGNOSIS — Z30431 Encounter for routine checking of intrauterine contraceptive device: Secondary | ICD-10-CM

## 2019-07-03 NOTE — Progress Notes (Signed)
  GYNECOLOGY OFFICE ENCOUNTER NOTE  History:  23 y.o. [id number] here today for today for IUD string check; Liletta  IUD was placed  PP.Pt reports that she is feeling the strings outside of her vagina.   Review of Systems:  Pertinent items are noted in HPI.   Objective:  Physical Exam Blood pressure 110/88, pulse 80, height 5\' 6"  (1.676 m), last menstrual period 08/28/2018, not currently breastfeeding.  CONSTITUTIONAL: Well-developed, well-nourished female in no acute distress.  HENT:  Normocephalic, atraumatic EYES: Conjunctivae and EOM are normal. No scleral icterus.  NECK: Normal range of motion SKIN: Skin is warm and dry. No rash noted. Not diaphoretic.No pallor. Frio: Alert and oriented to person, place, and time. Normal coordination.  PELVIC: Normal appearing external genitalia; normal appearing vaginal mucosa and cervix.  IUD strings visualized coming out of the vagina. The strings were trimmed to 3 cm.   Assessment & Plan:  Patient to keep IUD in place for up to seven years; can come in for removal if she desires pregnancy earlier or for any concerning side effects.  Pt will f/u in 4 weeks for her routine PP check. Will recheck her stings at that time.     Kaytelynn Scripter L. Harraway-Smith, MD, [place], [facility] for [employer], [place]

## 2019-07-03 NOTE — Progress Notes (Signed)
Patient is three weeks postpartum. Patient had IUD inserted after delivery.Patient had a lot of bleeding last night and able to see strings in the vagina. Kathrene Alu RN

## 2019-07-21 ENCOUNTER — Ambulatory Visit: Payer: Medicaid Other | Admitting: Obstetrics & Gynecology

## 2019-09-02 ENCOUNTER — Encounter (HOSPITAL_COMMUNITY): Payer: Self-pay | Admitting: Emergency Medicine

## 2019-09-02 ENCOUNTER — Emergency Department (HOSPITAL_COMMUNITY)
Admission: EM | Admit: 2019-09-02 | Discharge: 2019-09-02 | Disposition: A | Discharge status: Home / Self Care | Payer: Medicaid Other | Attending: Emergency Medicine | Admitting: Emergency Medicine

## 2019-09-02 ENCOUNTER — Other Ambulatory Visit: Payer: Self-pay

## 2019-09-02 DIAGNOSIS — H6121 Impacted cerumen, right ear: Secondary | ICD-10-CM | POA: Insufficient documentation

## 2019-09-02 DIAGNOSIS — Z79899 Other long term (current) drug therapy: Secondary | ICD-10-CM | POA: Diagnosis not present

## 2019-09-02 DIAGNOSIS — H6123 Impacted cerumen, bilateral: Secondary | ICD-10-CM | POA: Diagnosis not present

## 2019-09-02 DIAGNOSIS — H9201 Otalgia, right ear: Secondary | ICD-10-CM | POA: Diagnosis present

## 2019-09-02 NOTE — Discharge Instructions (Addendum)
You were seen in the ER today for ear pain. There was a large amount of wax in your ear which was removed with irritation. You may use over the counter ear drops such as debrox to help with this- please follow over the counter dosing instructions.   Follow up with primary care within 1 week for recheck of your ears. If you do not have primary care you may call our [place] clinic or the primary care phone number listed in your discharge instructions. Return to the ER for new or worsening symptoms including but not limited to increased pain, fever, redness outside or behind the ear, increased drainage, pus like drainage, hearing changed, or any other concerns.

## 2019-09-02 NOTE — ED Provider Notes (Signed)
[facility] EMERGENCY DEPARTMENT Provider Note   CSN: [id number] Arrival date & time: 09/02/19  1144     History Chief Complaint  Patient presents with  . Otalgia    Lauren Hanna is a 24 y.o. female without significant past medical hx who presents to the ED with complaints or R ear pain x 1 week. Patient states she is having pressure to the R ear, constant, no alleviating/aggravating factors, associated with decreased hearing & some drainage from the ear. Denies fever, chills, nasal congestion, sore throat, or cough. Denies recent swimming. Denies recent injury to the area.   HPI     Past Medical History:  Diagnosis Date  . Medical history non-contributory     Patient Active Problem List   Diagnosis Date Noted  . Encounter for IUD insertion 06/11/2019  . Encounter for induction of labor 06/10/2019  . Short interval between pregnancies affecting pregnancy, antepartum 06/10/2019  . Anemia, antepartum 03/28/2019  . Late prenatal care affecting pregnancy 03/28/2019  . Supervision of other normal pregnancy, antepartum 02/25/2019  . Open fracture of nasal bone with routine healing 06/26/2016    History reviewed. No pertinent surgical history.   OB History    Gravida  2   Para  2   Term  2   Preterm      AB      Living  2     SAB      TAB      Ectopic      Multiple  0   Live Births  2           Family History  Problem Relation Age of Onset  . Healthy Mother   . Healthy Father     Social History   Tobacco Use  . Smoking status: Never Smoker  . Smokeless tobacco: Never Used  Substance Use Topics  . Alcohol use: Not Currently    Comment: not while preg  . Drug use: No    Home Medications Prior to Admission medications   Medication Sig Start Date End Date Taking? Authorizing Provider  AMBULATORY NON FORMULARY MEDICATION 1 Device by Other route once a week. Blood pressure cuff/Medium  Monitored Regularly at home   ICD  10: Z34.90 LROB Patient not taking: Reported on 06/05/2019 02/25/19   Aviva Signs, CNM  benzocaine-Menthol (DERMOPLAST) 20-0.5 % AERO Apply 1 application topically as needed for irritation (perineal discomfort). 06/12/19   Arvilla Market, DO  cyclobenzaprine (FLEXERIL) 10 MG tablet Take 1 tablet (10 mg total) by mouth 3 (three) times daily as needed (back pain). Patient not taking: Reported on 06/05/2019 05/01/19   Donette Larry, CNM  Fe Fum-FePoly-FA-Vit C-Vit B3 (INTEGRA F) 125-1 MG CAPS Take 1 capsule by mouth daily. 03/28/19   Willodean Rosenthal, MD  ibuprofen (ADVIL) 600 MG tablet Take 1 tablet (600 mg total) by mouth every 6 (six) hours. 06/12/19   Arvilla Market, DO  senna-docusate (SENOKOT-S) 8.6-50 MG tablet Take 2 tablets by mouth daily. 06/13/19   Arvilla Market, DO    Allergies    Patient has no known allergies.  Review of Systems   Review of Systems  Constitutional: Negative for chills and fever.  HENT: Positive for ear discharge, ear pain and hearing loss. Negative for congestion, sore throat, trouble swallowing and voice change.   Respiratory: Negative for cough and shortness of breath.   Cardiovascular: Negative for chest pain.  Skin: Negative for color change and wound.  Physical Exam Updated Vital Signs BP 116/74 (BP Location: Left Arm)   Pulse 79   Temp 98.3 F (36.8 C) (Oral)   Resp 14   LMP 08/28/2018 (Exact Date)   SpO2 100%   Physical Exam Vitals and nursing note reviewed.  Constitutional:      General: She is not in acute distress.    Appearance: She is well-developed.  HENT:     Head: Normocephalic and atraumatic.     Right Ear: Tympanic membrane is not perforated, erythematous, retracted or bulging.     Left Ear: Tympanic membrane is not perforated, erythematous, retracted or bulging.     Ears:     Comments: Significant amount of cerumen present in bilateral EACs R>L making visualization of TM difficult on  initial assessment. No purulent drainage, erythema, or canal edema noted. No mastoid erythema/swelling/tenderness.  No pain with palpation of the external ear. No tragal tenderness.     Nose:     Right Sinus: No maxillary sinus tenderness or frontal sinus tenderness.     Left Sinus: No maxillary sinus tenderness or frontal sinus tenderness.     Mouth/Throat:     Pharynx: Uvula midline. No oropharyngeal exudate or posterior oropharyngeal erythema.     Comments: Posterior oropharynx is symmetric appearing. Patient tolerating own secretions without difficulty. No trismus. No drooling. No hot potato voice. No swelling beneath the tongue, submandibular compartment is soft.  Eyes:     General:        Right eye: No discharge.        Left eye: No discharge.     Conjunctiva/sclera: Conjunctivae normal.     Pupils: Pupils are equal, round, and reactive to light.  Cardiovascular:     Rate and Rhythm: Normal rate and regular rhythm.     Heart sounds: No murmur.  Pulmonary:     Effort: Pulmonary effort is normal. No respiratory distress.     Breath sounds: Normal breath sounds. No wheezing, rhonchi or rales.  Abdominal:     General: There is no distension.     Palpations: Abdomen is soft.     Tenderness: There is no abdominal tenderness.  Musculoskeletal:     Cervical back: Normal range of motion and neck supple. No edema or rigidity.  Lymphadenopathy:     Cervical: No cervical adenopathy.  Skin:    General: Skin is warm and dry.     Findings: No rash.  Neurological:     Mental Status: She is alert.  Psychiatric:        Behavior: Behavior normal.     ED Results / Procedures / Treatments   Labs (all labs ordered are listed, but only abnormal results are displayed) Labs Reviewed - No data to display  EKG None  Radiology No results found.  Procedures .Ear Cerumen Removal  Date/Time: 09/02/2019 12:54 PM Performed by: Amaryllis Dyke, PA-C Authorized by: Amaryllis Dyke, PA-C   Consent:    Consent obtained:  Verbal   Consent given by:  Patient   Risks discussed:  Bleeding, dizziness, incomplete removal, infection, pain and TM perforation   Alternatives discussed:  No treatment Procedure details:    Location: bilateral.   Procedure type: irrigation   Post-procedure details:    Inspection:  TM intact   Hearing quality:  Normal   Patient tolerance of procedure:  Tolerated well, no immediate complications   (including critical care time)  Medications Ordered in ED Medications - No data to display  ED Course  I have reviewed the triage vital signs and the nursing notes.  Pertinent labs & imaging results that were available during my care of the patient were reviewed by me and considered in my medical decision making (see chart for details).    MDM Rules/Calculators/A&P                      Patient presents to the ED with complaints of R ear pressure, drainage, & hearing loss. Exam w/ significant amount of cerumen present bilaterally, R>L. Irrigation performed with resolution of sxs. Patient feeling much better, hearing back to normal. Re-inspection of bilateral ears- no signs of mastoiditis, cellulitis, abscess, TM perf, AOE, or AOM. Suspect sxs likely secondary to cerumen given resolution. Appears appropriate for discharge home. I discussed treatment, follow up & return precautions with the patient. Provided opportunity for questions, patient confirmed understanding and is in agreement with plan.   Final Clinical Impression(s) / ED Diagnoses Final diagnoses:  Impacted cerumen of right ear    Rx / DC Orders ED Discharge Orders    None       Cherly Anderson, PA-C 09/02/19 1257    Benjiman Core, MD 09/03/19 [phone]

## 2019-09-02 NOTE — ED Triage Notes (Signed)
Pt states she has been having right ear pain for one week. States the pain has increased and now she feels like she can't hear out of it. It is all draining pus per the pt.

## 2019-09-02 NOTE — ED Notes (Signed)
Irrigated pts right ear and cleaned out some wax.

## 2019-09-02 NOTE — ED Notes (Signed)
Patient given discharge instructions patient verbalizes understanding. 

## 2019-10-09 ENCOUNTER — Ambulatory Visit: Payer: Medicaid Other | Admitting: Family Medicine

## 2019-10-16 ENCOUNTER — Other Ambulatory Visit: Payer: Self-pay

## 2019-10-16 ENCOUNTER — Ambulatory Visit (INDEPENDENT_AMBULATORY_CARE_PROVIDER_SITE_OTHER): Payer: Medicaid Other | Admitting: Family Medicine

## 2019-10-16 ENCOUNTER — Encounter: Payer: Self-pay | Admitting: Family Medicine

## 2019-10-16 VITALS — BP 112/82 | Ht 66.0 in

## 2019-10-16 DIAGNOSIS — Z30431 Encounter for routine checking of intrauterine contraceptive device: Secondary | ICD-10-CM | POA: Diagnosis not present

## 2019-10-16 NOTE — Progress Notes (Signed)
IUD string check. Teran Knittle RN 

## 2019-10-16 NOTE — Progress Notes (Signed)
   Subjective:   Patient Name: Lauren Hanna, female   DOB: 14-Feb-1996, 24 y.o.  MRN: [id number]  HPI Patient here for an IUD check.  She had the Liletta IUD placed 1 month ago.  She reports occasional cramping.   Review of Systems  Constitutional: Negative for fever and chills.  Gastrointestinal: Negative for abdominal pain.  Genitourinary: Negative for vaginal discharge, vaginal pain, pelvic pain and dyspareunia.        Objective:   Physical Exam  Constitutional: She appears well-developed and well-nourished.  HENT:  Head: Normocephalic and atraumatic.  Abdominal: Soft. There is no tenderness. There is no guarding.  Genitourinary: There is no rash, tenderness or lesion on the right labia. There is no rash, tenderness or lesion on the left labia. No erythema or tenderness in the vagina. No foreign body around the vagina. No signs of injury around the vagina. No vaginal discharge found.    Skin: Skin is warm and dry.  Psychiatric: She has a normal mood and affect. Her behavior is normal. Judgment and thought content normal.       Assessment & Plan:  1. IUD check up IUD in place.  Pt to call with any other problems.  Recheck in 1 year.

## 2020-03-03 ENCOUNTER — Other Ambulatory Visit: Payer: Self-pay

## 2020-03-03 ENCOUNTER — Encounter (HOSPITAL_COMMUNITY): Payer: Self-pay | Admitting: Emergency Medicine

## 2020-03-03 ENCOUNTER — Emergency Department (HOSPITAL_COMMUNITY)
Admission: EM | Admit: 2020-03-03 | Discharge: 2020-03-03 | Disposition: A | Discharge status: Home / Self Care | Payer: Medicaid Other | Attending: Emergency Medicine | Admitting: Emergency Medicine

## 2020-03-03 DIAGNOSIS — R3915 Urgency of urination: Secondary | ICD-10-CM | POA: Diagnosis not present

## 2020-03-03 DIAGNOSIS — R1030 Lower abdominal pain, unspecified: Secondary | ICD-10-CM | POA: Diagnosis present

## 2020-03-03 DIAGNOSIS — N3090 Cystitis, unspecified without hematuria: Secondary | ICD-10-CM | POA: Diagnosis not present

## 2020-03-03 DIAGNOSIS — M545 Low back pain: Secondary | ICD-10-CM | POA: Diagnosis not present

## 2020-03-03 DIAGNOSIS — R350 Frequency of micturition: Secondary | ICD-10-CM | POA: Diagnosis not present

## 2020-03-03 LAB — COMPREHENSIVE METABOLIC PANEL
ALT: 13 U/L (ref 0–44)
AST: 16 U/L (ref 15–41)
Albumin: 3.9 g/dL (ref 3.5–5.0)
Alkaline Phosphatase: 49 U/L (ref 38–126)
Anion gap: 8 (ref 5–15)
BUN: 7 mg/dL (ref 6–20)
CO2: 26 mmol/L (ref 22–32)
Calcium: 9.2 mg/dL (ref 8.9–10.3)
Chloride: 102 mmol/L (ref 98–111)
Creatinine, Ser: 0.78 mg/dL (ref 0.44–1.00)
GFR calc Af Amer: >60 mL/min (ref >60)
GFR calc non Af Amer: >60 mL/min (ref >60)
Glucose, Bld: 97 mg/dL (ref 70–99)
Potassium: 3.7 mmol/L (ref 3.5–5.1)
Sodium: 136 mmol/L (ref 135–145)
Total Bilirubin: 1.1 mg/dL (ref 0.3–1.2)
Total Protein: 6.6 g/dL (ref 6.5–8.1)

## 2020-03-03 LAB — URINALYSIS, ROUTINE W REFLEX MICROSCOPIC
Bilirubin Urine: NEGATIVE
Glucose, UA: NEGATIVE mg/dL
Ketones, ur: NEGATIVE mg/dL
Nitrite: NEGATIVE
Protein, ur: 100 mg/dL — AB
RBC / HPF: >50 RBC/hpf — ABNORMAL HIGH (ref 0–5)
Specific Gravity, Urine: 1.009 (ref 1.005–1.030)
WBC, UA: >50 WBC/hpf — ABNORMAL HIGH (ref 0–5)
pH: 9 — ABNORMAL HIGH (ref 5.0–8.0)

## 2020-03-03 LAB — CBC
HCT: 35.5 % — ABNORMAL LOW (ref 36.0–46.0)
Hemoglobin: 11.9 g/dL — ABNORMAL LOW (ref 12.0–15.0)
MCH: 31.2 pg (ref 26.0–34.0)
MCHC: 33.5 g/dL (ref 30.0–36.0)
MCV: 92.9 fL (ref 80.0–100.0)
Platelets: 274 10*3/uL (ref 150–400)
RBC: 3.82 MIL/uL — ABNORMAL LOW (ref 3.87–5.11)
RDW: 12 % (ref 11.5–15.5)
WBC: 10.2 10*3/uL (ref 4.0–10.5)
nRBC: 0 % (ref 0.0–0.2)

## 2020-03-03 LAB — I-STAT BETA HCG BLOOD, ED (MC, WL, AP ONLY): I-stat hCG, quantitative: <5 m[IU]/mL (ref <5)

## 2020-03-03 LAB — LIPASE, BLOOD: Lipase: 19 U/L (ref 11–51)

## 2020-03-03 MED ORDER — SODIUM CHLORIDE 0.9 % IV SOLN
1.0000 g | Freq: Once | INTRAVENOUS | Status: AC
  Administered 2020-03-03: 1 g via INTRAVENOUS
  Filled 2020-03-03: qty 10

## 2020-03-03 MED ORDER — SODIUM CHLORIDE 0.9 % IV BOLUS
1000.0000 mL | Freq: Once | INTRAVENOUS | Status: AC
  Administered 2020-03-03: 1000 mL via INTRAVENOUS

## 2020-03-03 MED ORDER — CEPHALEXIN 500 MG PO CAPS: 500.0000 mg | ORAL_CAPSULE | Freq: Three times a day (TID) | ORAL | 0 refills | Status: AC

## 2020-03-03 NOTE — ED Provider Notes (Signed)
[facility] EMERGENCY DEPARTMENT Provider Note   CSN: [id number] Arrival date & time: 03/03/20  1158     History Chief Complaint  Patient presents with  . Abdominal Pain  . Urinary Frequency    Lauren Hanna is a 24 y.o. female.  Patient reports urinary frequency since Saturday.  Reports feeling like not completely emptying bladder.  Denies any dysuria, hematuria or vaginal discharge.  Endorses suprapubic pressure and lower back pain.  Back pain does not radiate to groin.  Denies any fevers or chills.  No decrease in appetite, nausea or vomiting.  LMP 07/15.  Reports that menses will start tomorrow or Friday.  Has Mirena and uses condoms so reports no pregnancy.        Past Medical History:  Diagnosis Date  . Medical history non-contributory     Patient Active Problem List   Diagnosis Date Noted  . Encounter for IUD insertion 06/11/2019  . Encounter for induction of labor 06/10/2019  . Short interval between pregnancies affecting pregnancy, antepartum 06/10/2019  . Anemia, antepartum 03/28/2019  . Late prenatal care affecting pregnancy 03/28/2019  . Supervision of other normal pregnancy, antepartum 02/25/2019  . Open fracture of nasal bone with routine healing 06/26/2016    History reviewed. No pertinent surgical history.   OB History    Gravida  2   Para  2   Term  2   Preterm      AB      Living  2     SAB      TAB      Ectopic      Multiple  0   Live Births  2           Family History  Problem Relation Age of Onset  . Healthy Mother   . Healthy Father     Social History   Tobacco Use  . Smoking status: Never Smoker  . Smokeless tobacco: Never Used  Vaping Use  . Vaping Use: Never used  Substance Use Topics  . Alcohol use: Not Currently    Comment: not while preg  . Drug use: No    Home Medications Prior to Admission medications   Medication Sig Start Date End Date Taking? Authorizing Provider    benzocaine-Menthol (DERMOPLAST) 20-0.5 % AERO Apply 1 application topically as needed for irritation (perineal discomfort). Patient not taking: Reported on 10/16/2019 06/12/19   Arvilla Market, DO  cephALEXin (KEFLEX) 500 MG capsule Take 1 capsule (500 mg total) by mouth 3 (three) times daily for 7 days. 03/03/20 03/10/20  Dana Allan, MD  Fe Fum-FePoly-FA-Vit C-Vit B3 (INTEGRA F) 125-1 MG CAPS Take 1 capsule by mouth daily. Patient not taking: Reported on 10/16/2019 03/28/19   Willodean Rosenthal, MD  ibuprofen (ADVIL) 600 MG tablet Take 1 tablet (600 mg total) by mouth every 6 (six) hours. Patient not taking: Reported on 10/16/2019 06/12/19   Arvilla Market, DO  senna-docusate (SENOKOT-S) 8.6-50 MG tablet Take 2 tablets by mouth daily. Patient not taking: Reported on 10/16/2019 06/13/19   Arvilla Market, DO    Allergies    Patient has no known allergies.  Review of Systems   Review of Systems  Constitutional: Negative for chills and fever.  Gastrointestinal: Negative for diarrhea, nausea and vomiting.  Genitourinary: Positive for flank pain, frequency and urgency. Negative for difficulty urinating, dysuria, hematuria, vaginal bleeding and vaginal discharge.    Physical Exam Updated Vital Signs BP 102/75 (BP Location: Right  Arm)   Pulse 91   Temp 99.5 F (37.5 C) (Oral)   Resp 16   Ht 5\' 6"  (1.676 m)   Wt 63.5 kg   LMP 02/05/2020   SpO2 99%   BMI 22.60 kg/m   Physical Exam HENT:     Mouth/Throat:     Mouth: Mucous membranes are moist.  Cardiovascular:     Rate and Rhythm: Normal rate and regular rhythm.     Heart sounds: Normal heart sounds.  Pulmonary:     Effort: Pulmonary effort is normal.     Breath sounds: Normal breath sounds.  Abdominal:     General: Abdomen is flat. Bowel sounds are normal. There is no distension.     Palpations: Abdomen is soft.     Tenderness: There is abdominal tenderness in the suprapubic area. There is  right CVA tenderness and left CVA tenderness. There is no guarding or rebound.  Skin:    General: Skin is warm and dry.     Capillary Refill: Capillary refill takes less than 2 seconds.  Neurological:     General: No focal deficit present.     Mental Status: She is alert.     ED Results / Procedures / Treatments   Labs (all labs ordered are listed, but only abnormal results are displayed) Labs Reviewed  CBC - Abnormal; Notable for the following components:      Result Value   RBC 3.82 (*)    Hemoglobin 11.9 (*)    HCT 35.5 (*)    All other components within normal limits  URINALYSIS, ROUTINE W REFLEX MICROSCOPIC - Abnormal; Notable for the following components:   APPearance CLOUDY (*)    pH 9.0 (*)    Hgb urine dipstick LARGE (*)    Protein, ur 100 (*)    Leukocytes,Ua LARGE (*)    RBC / HPF >50 (*)    WBC, UA >50 (*)    Bacteria, UA RARE (*)    All other components within normal limits  LIPASE, BLOOD  COMPREHENSIVE METABOLIC PANEL  I-STAT BETA HCG BLOOD, ED ([facility], [facility], [facility] ONLY)    EKG None  Radiology No results found.  Procedures Procedures (including critical care time)  Medications Ordered in ED Medications  sodium chloride 0.9 % bolus 1,000 mL (0 mLs Intravenous Stopped 03/03/20 2313)  cefTRIAXone (ROCEPHIN) 1 g in sodium chloride 0.9 % 100 mL IVPB (0 g Intravenous Stopped 03/03/20 2313)    ED Course  I have reviewed the triage vital signs and the nursing notes.  Pertinent labs & imaging results that were available during my care of the patient were reviewed by me and considered in my medical decision making (see chart for details).    MDM Rules/Calculators/A&P                          Lauren Hanna is a 24 y.o. female who presented to the ED with abdominal pain and urinary frequency.  Patient appears well and in mild discomfort.  Abdominal exam positive for suprapubic pain and bilateral CVA tenderness without radiation.  Afebrile and no chills.  Urine  positive for large amount leukocytes.  Given that patient reports menses to start in the next few day, hematuria likely secondary to menses.  Considered renal stones but given mild discomfort and report of menses to start less likely nephrolithiasis as cause of pain and hematuria.  Will hydrate with bolus IV fluids and treat with  Rocephin 1gm IV for acute cystitis. Once completed can discharge home with prescription for Keflex 500 mg TID x 7/7 and follow up with PCP in 1 week.  11:54 PM Final Clinical Impression(s) / ED Diagnoses Final diagnoses:  Cystitis    Rx / DC Orders ED Discharge Orders         Ordered    cephALEXin (KEFLEX) 500 MG capsule  3 times daily     Discontinue  Reprint     03/03/20 2309           Dana Allan, MD 03/03/20 2354    Charlynne Pander, MD 03/04/20 1521

## 2020-03-03 NOTE — ED Triage Notes (Signed)
Onset 2-3 days ago developed lower abdominal pain with urinary frequency however not urinating a lot.

## 2020-03-03 NOTE — Discharge Instructions (Addendum)
Take Keflex 500 mg three times a day for 7 days.

## 2020-03-04 ENCOUNTER — Telehealth: Payer: Self-pay | Admitting: *Deleted

## 2020-03-04 NOTE — Telephone Encounter (Signed)
Contacted pt to complete transition of care assessment:  Transition Care Management Follow-up Telephone Call   [facility] Managed Care Transition Call Status:[facility] Call Made   Date of discharge and from where: [facility], 03/03/20   How have you been since you were released from the hospital? "still having pain"   Any questions or concerns? No  Items Reviewed:  Did the pt receive and understand the discharge instructions provided? Yes   Medications obtained and verified? Not pt has not picked up prescription yet.she will pick it up 03/04/20  Any new allergies since your discharge? No   Dietary orders reviewed? No  Do you have support at home? Yes, family  Functional Questionnaire: (I = Independent and D = Dependent)  ADLs: Independent Bathing/Dressing:Independent Meal Prep: Independent Eating: Independent Maintaining continence: Independent Transferring/Ambulation: Independent Managing Meds: Independent   Follow up appointments reviewed:   PCP Hospital f/u appt confirmed? No Pt to cal Dr Adrian Blackwater on 03/04/20 to schedule follow up appt  Specialist Hospital f/u appt confirmed?  n/a Are transportation arrangements needed? No   If their condition worsens, is the pt aware to call PCP or go to the EmergencyDept.?  Yes  Was the patient provided with contact information for the PCP's office or ED? yes  Was to pt encouraged to call back with questions or concerns? yes  Burnard Bunting, RN, BSN, CCRN [facility] [phone]

## 2020-03-18 ENCOUNTER — Other Ambulatory Visit: Payer: Self-pay

## 2020-03-18 ENCOUNTER — Ambulatory Visit (INDEPENDENT_AMBULATORY_CARE_PROVIDER_SITE_OTHER): Payer: Medicaid Other | Admitting: Family Medicine

## 2020-03-18 ENCOUNTER — Other Ambulatory Visit (HOSPITAL_COMMUNITY)
Admission: RE | Admit: 2020-03-18 | Discharge: 2020-03-18 | Disposition: A | Discharge status: Home / Self Care | Payer: Medicaid Other | Source: Ambulatory Visit | Attending: Family Medicine | Admitting: Family Medicine

## 2020-03-18 ENCOUNTER — Encounter: Payer: Self-pay | Admitting: Family Medicine

## 2020-03-18 VITALS — BP 101/73 | HR 75 | Ht 66.0 in | Wt 128.0 lb

## 2020-03-18 DIAGNOSIS — Z30431 Encounter for routine checking of intrauterine contraceptive device: Secondary | ICD-10-CM | POA: Diagnosis not present

## 2020-03-18 DIAGNOSIS — Z01419 Encounter for gynecological examination (general) (routine) without abnormal findings: Secondary | ICD-10-CM | POA: Insufficient documentation

## 2020-03-18 NOTE — Progress Notes (Signed)
GYNECOLOGY ANNUAL PREVENTATIVE CARE ENCOUNTER NOTE  Subjective:   Lauren Hanna is a 24 y.o. [id number] female here for a routine annual gynecologic exam.  Current complaints: none.   Denies abnormal vaginal bleeding, discharge, pelvic pain, problems with intercourse or other gynecologic concerns.    Gynecologic History Patient's last menstrual period was 02/02/2020. Patient is sexually active  Contraception: IUD Last Pap: about 3 years ago. Results were: normal Last mammogram: n/a.  Obstetric History OB History  Gravida Para Term Preterm AB Living  2 2 2     2   SAB TAB Ectopic Multiple Live Births        0 2    # Outcome Date GA Lbr Len/2nd Weight Sex Delivery Anes PTL Lv  2 Term 06/10/19 [redacted]w[redacted]d 552:52 / 00:06 7 lb 6.5 oz (3.36 kg) F Vag-Spont None  LIV  1 Term 2019 [redacted]w[redacted]d  6 lb 7 oz (2.92 kg) F Vag-Spont None N LIV    Past Medical History:  Diagnosis Date   Medical history non-contributory     No past surgical history on file.  Current Outpatient Medications on File Prior to Visit  Medication Sig Dispense Refill   benzocaine-Menthol (DERMOPLAST) 20-0.5 % AERO Apply 1 application topically as needed for irritation (perineal discomfort). (Patient not taking: Reported on 10/16/2019) 56 g 0   Fe Fum-FePoly-FA-Vit C-Vit B3 (INTEGRA F) 125-1 MG CAPS Take 1 capsule by mouth daily. (Patient not taking: Reported on 10/16/2019) 30 capsule 1   ibuprofen (ADVIL) 600 MG tablet Take 1 tablet (600 mg total) by mouth every 6 (six) hours. (Patient not taking: Reported on 10/16/2019) 30 tablet 0   senna-docusate (SENOKOT-S) 8.6-50 MG tablet Take 2 tablets by mouth daily. (Patient not taking: Reported on 10/16/2019) 30 tablet 0   No current facility-administered medications on file prior to visit.    No Known Allergies  Social History   Socioeconomic History   Marital status: Single    Spouse name: Not on file   Number of children: Not on file   Years of education: Not on  file   Highest education level: Not on file  Occupational History   Not on file  Tobacco Use   Smoking status: Never Smoker   Smokeless tobacco: Never Used  Vaping Use   Vaping Use: Never used  Substance and Sexual Activity   Alcohol use: Not Currently    Comment: not while preg   Drug use: No   Sexual activity: Yes    Birth control/protection: None    Comment: last sex past month  Other Topics Concern   Not on file  Social History Narrative   Not on file   Social Determinants of Health   Financial Resource Strain:    Difficulty of Paying Living Expenses: Not on file  Food Insecurity:    Worried About 10/18/2019 in the Last Year: Not on file   Programme researcher, broadcasting/film/video of Food in the Last Year: Not on file  Transportation Needs:    Lack of Transportation (Medical): Not on file   Lack of Transportation (Non-Medical): Not on file  Physical Activity:    Days of Exercise per Week: Not on file   Minutes of Exercise per Session: Not on file  Stress:    Feeling of Stress : Not on file  Social Connections:    Frequency of Communication with Friends and Family: Not on file   Frequency of Social Gatherings with Friends and Family: Not on file  Attends Religious Services: Not on file   Active Member of Clubs or Organizations: Not on file   Attends Banker Meetings: Not on file   Marital Status: Not on file  Intimate Partner Violence:    Fear of Current or Ex-Partner: Not on file   Emotionally Abused: Not on file   Physically Abused: Not on file   Sexually Abused: Not on file    Family History  Problem Relation Age of Onset   Healthy Mother    Healthy Father     The following portions of the patient's history were reviewed and updated as appropriate: allergies, current medications, past family history, past medical history, past social history, past surgical history and problem list.  Review of Systems Pertinent items are noted in  HPI.   Objective:  BP 101/73    Pulse 75    Ht 5\' 6"  (1.676 m)    Wt 128 lb (58.1 kg)    LMP 02/02/2020    Breastfeeding No    BMI 20.66 kg/m  Wt Readings from Last 3 Encounters:  03/18/20 128 lb (58.1 kg)  03/03/20 140 lb (63.5 kg)  06/10/19 149 lb 14.6 oz (68 kg)     Chaperone present during exam  CONSTITUTIONAL: Well-developed, well-nourished female in no acute distress.  HENT:  Normocephalic, atraumatic, External right and left ear normal. Oropharynx is clear and moist EYES: Conjunctivae and EOM are normal. Pupils are equal, round, and reactive to light. No scleral icterus.  NECK: Normal range of motion, supple, no masses.  Normal thyroid.   CARDIOVASCULAR: Normal heart rate noted, regular rhythm RESPIRATORY: Clear to auscultation bilaterally. Effort and breath sounds normal, no problems with respiration noted. BREASTS: Symmetric in size. No masses, skin changes, nipple drainage, or lymphadenopathy. ABDOMEN: Soft, normal bowel sounds, no distention noted.  No tenderness, rebound or guarding.  PELVIC: Normal appearing external genitalia; normal appearing vaginal mucosa and cervix.  No abnormal discharge noted.  IUD string visualized MUSCULOSKELETAL: Normal range of motion. No tenderness.  No cyanosis, clubbing, or edema.  2+ distal pulses. SKIN: Skin is warm and dry. No rash noted. Not diaphoretic. No erythema. No pallor. NEUROLOGIC: Alert and oriented to person, place, and time. Normal reflexes, muscle tone coordination. No cranial nerve deficit noted. PSYCHIATRIC: Normal mood and affect. Normal behavior. Normal judgment and thought content.  Assessment:  Annual gynecologic examination with pap smear   Plan:  1. Well Woman Exam Will follow up results of pap smear and manage accordingly.   Routine preventative health maintenance measures emphasized. Please refer to After Visit Summary for other counseling recommendations.    06/12/19, DO [facility]

## 2020-03-19 LAB — CYTOLOGY - PAP: Diagnosis: NEGATIVE

## 2020-04-29 ENCOUNTER — Ambulatory Visit (HOSPITAL_COMMUNITY): Payer: Medicaid Other

## 2020-04-29 ENCOUNTER — Ambulatory Visit (HOSPITAL_COMMUNITY)
Admission: EM | Admit: 2020-04-29 | Discharge: 2020-04-29 | Disposition: A | Discharge status: Home / Self Care | Payer: Medicaid Other | Attending: Family Medicine | Admitting: Family Medicine

## 2020-04-29 ENCOUNTER — Other Ambulatory Visit: Payer: Self-pay

## 2020-04-29 ENCOUNTER — Encounter (HOSPITAL_COMMUNITY): Payer: Self-pay

## 2020-04-29 DIAGNOSIS — Z3202 Encounter for pregnancy test, result negative: Secondary | ICD-10-CM | POA: Diagnosis not present

## 2020-04-29 DIAGNOSIS — R102 Pelvic and perineal pain: Secondary | ICD-10-CM | POA: Diagnosis not present

## 2020-04-29 LAB — POC URINE PREG, ED: Preg Test, Ur: NEGATIVE

## 2020-04-29 LAB — POCT URINALYSIS DIPSTICK, ED / UC
Bilirubin Urine: NEGATIVE
Glucose, UA: NEGATIVE mg/dL
Hgb urine dipstick: NEGATIVE
Ketones, ur: NEGATIVE mg/dL
Leukocytes,Ua: NEGATIVE
Nitrite: NEGATIVE
Protein, ur: NEGATIVE mg/dL
Specific Gravity, Urine: 1.015 (ref 1.005–1.030)
Urobilinogen, UA: 0.2 mg/dL (ref 0.0–1.0)
pH: 5.5 (ref 5.0–8.0)

## 2020-04-29 NOTE — Discharge Instructions (Addendum)
Your urine did not show any infection and pregnancy test was negative. Your IUD seems to be in place We will send a swab for testing for gonorrhea, chlamydia, trichomonas, BV and yeast You can check your MyChart for results Follow up as needed for continued or worsening symptoms

## 2020-04-29 NOTE — ED Triage Notes (Signed)
Pt presents with lower pelvic cramping X 1 week.

## 2020-04-30 ENCOUNTER — Telehealth (HOSPITAL_COMMUNITY): Payer: Self-pay | Admitting: Emergency Medicine

## 2020-04-30 LAB — CERVICOVAGINAL ANCILLARY ONLY
Bacterial Vaginitis (gardnerella): POSITIVE — AB
Candida Glabrata: NEGATIVE
Candida Vaginitis: NEGATIVE
Chlamydia: NEGATIVE
Comment: NEGATIVE
Comment: NEGATIVE
Comment: NEGATIVE
Comment: NEGATIVE
Comment: NEGATIVE
Comment: NORMAL
Neisseria Gonorrhea: NEGATIVE
Trichomonas: NEGATIVE

## 2020-04-30 MED ORDER — METRONIDAZOLE 500 MG PO TABS: 500.0000 mg | ORAL_TABLET | Freq: Two times a day (BID) | ORAL | 0 refills | Status: DC

## 2020-04-30 NOTE — ED Provider Notes (Signed)
[facility]    CSN: [id number] Arrival date & time: 04/29/20  1241      History   Chief Complaint Chief Complaint  Patient presents with  . Appointment  . Abdominal Pain    HPI Lauren Hanna is a 24 y.o. female.   Patient is a 24 year old female who presents today with lower pelvic cramping x1 week.  Symptoms have been waxing waning.  She has had some vaginal discharge.  Will like to be checked for STDs.  Patient has IUD this was placed approximate 1 year ago.  Does not have a menstrual cycle.  No fever, nausea, dysuria, hematuria urinary frequency.     Past Medical History:  Diagnosis Date  . Medical history non-contributory     Patient Active Problem List   Diagnosis Date Noted  . Anemia, antepartum 03/28/2019  . Open fracture of nasal bone with routine healing 06/26/2016    History reviewed. No pertinent surgical history.  OB History    Gravida  2   Para  2   Term  2   Preterm      AB      Living  2     SAB      TAB      Ectopic      Multiple  0   Live Births  2            Home Medications    Prior to Admission medications   Not on File    Family History Family History  Problem Relation Age of Onset  . Healthy Mother   . Healthy Father     Social History Social History   Tobacco Use  . Smoking status: Never Smoker  . Smokeless tobacco: Never Used  Vaping Use  . Vaping Use: Never used  Substance Use Topics  . Alcohol use: Not Currently    Comment: not while preg  . Drug use: No     Allergies   Patient has no known allergies.   Review of Systems Review of Systems   Physical Exam Triage Vital Signs ED Triage Vitals [04/29/20 1346]  Enc Vitals Group     BP 120/77     Pulse Rate 79     Resp 17     Temp 99.1 F (37.3 C)     Temp Source Oral     SpO2 97 %     Weight      Height      Head Circumference      Peak Flow      Pain Score 5     Pain Loc      Pain Edu?      Excl. in GC?     No data found.  Updated Vital Signs BP 120/77 (BP Location: Right Arm)   Pulse 79   Temp 99.1 F (37.3 C) (Oral)   Resp 17   LMP 04/23/2020   SpO2 97%   Visual Acuity Right Eye Distance:   Left Eye Distance:   Bilateral Distance:    Right Eye Near:   Left Eye Near:    Bilateral Near:     Physical Exam Vitals and nursing note reviewed.  Constitutional:      General: She is not in acute distress.    Appearance: Normal appearance. She is not ill-appearing, toxic-appearing or diaphoretic.  HENT:     Head: Normocephalic.     Nose: Nose normal.  Eyes:     Conjunctiva/sclera: Conjunctivae  normal.  Pulmonary:     Effort: Pulmonary effort is normal.  Abdominal:     Comments: Mild tenderness to lower pelvic area on palpation.   Genitourinary:    Comments: External vaginal exam without lesions, swelling, discharge  Internal vaginal exam with thick white vaginal discharge.  Mild cervical irritation.  IUD strings visible  No CMT  Musculoskeletal:        General: Normal range of motion.     Cervical back: Normal range of motion.  Skin:    General: Skin is warm and dry.     Findings: No rash.  Neurological:     Mental Status: She is alert.  Psychiatric:        Mood and Affect: Mood normal.      UC Treatments / Results  Labs (all labs ordered are listed, but only abnormal results are displayed) Labs Reviewed  POC URINE PREG, ED  POCT URINALYSIS DIPSTICK, ED / UC  CERVICOVAGINAL ANCILLARY ONLY    EKG   Radiology No results found.  Procedures Procedures (including critical care time)  Medications Ordered in UC Medications - No data to display  Initial Impression / Assessment and Plan / UC Course  I have reviewed the triage vital signs and the nursing notes.  Pertinent labs & imaging results that were available during my care of the patient were reviewed by me and considered in my medical decision making (see chart for details).     Pelvic  cramping No concerns for PID on exam.  IUD strings visible. Swab sent for testing Recommended check MyChart for results. Urine without any infection or pregnancy Follow up as needed for continued or worsening symptoms  Final Clinical Impressions(s) / UC Diagnoses   Final diagnoses:  Pelvic cramping     Discharge Instructions     Your urine did not show any infection and pregnancy test was negative. Your IUD seems to be in place We will send a swab for testing for gonorrhea, chlamydia, trichomonas, BV and yeast You can check your MyChart for results Follow up as needed for continued or worsening symptoms     ED Prescriptions    None     PDMP not reviewed this encounter.   Janace Aris, NP 04/30/20 1129

## 2020-05-12 ENCOUNTER — Ambulatory Visit (INDEPENDENT_AMBULATORY_CARE_PROVIDER_SITE_OTHER): Payer: Medicaid Other

## 2020-05-12 ENCOUNTER — Other Ambulatory Visit (HOSPITAL_COMMUNITY)
Admission: RE | Admit: 2020-05-12 | Discharge: 2020-05-12 | Disposition: A | Discharge status: Home / Self Care | Payer: Medicaid Other | Source: Ambulatory Visit | Attending: Obstetrics & Gynecology | Admitting: Obstetrics & Gynecology

## 2020-05-12 ENCOUNTER — Other Ambulatory Visit: Payer: Self-pay

## 2020-05-12 VITALS — BP 102/81 | HR 77 | Wt 127.0 lb

## 2020-05-12 DIAGNOSIS — N898 Other specified noninflammatory disorders of vagina: Secondary | ICD-10-CM

## 2020-05-12 NOTE — Progress Notes (Addendum)
Pt presents with vaginal discharge and odor.Pt states she was treated for BV over a week ago but she is still having odor, discharge, and  irritation. Pt requests repeat STD testing and wet prep. Self swab was sent to the lab. Kaymen Adrian l Anshi Jalloh, CMA   Attestation of Attending Supervision of CMA/RN: Evaluation and management procedures were performed by the nurse under my supervision and collaboration.  I have reviewed the nursing note and chart, and I agree with the management and plan.  Carolyn L. Harraway-Smith, M.D., [facility]

## 2020-05-13 LAB — CERVICOVAGINAL ANCILLARY ONLY
Bacterial Vaginitis (gardnerella): POSITIVE — AB
Candida Glabrata: NEGATIVE
Candida Vaginitis: POSITIVE — AB
Chlamydia: NEGATIVE
Comment: NEGATIVE
Comment: NEGATIVE
Comment: NEGATIVE
Comment: NEGATIVE
Comment: NEGATIVE
Comment: NORMAL
Neisseria Gonorrhea: NEGATIVE
Trichomonas: NEGATIVE

## 2020-05-17 ENCOUNTER — Other Ambulatory Visit: Payer: Self-pay | Admitting: Obstetrics & Gynecology

## 2020-05-18 ENCOUNTER — Telehealth: Payer: Self-pay

## 2020-05-18 MED ORDER — FLUCONAZOLE 150 MG PO TABS
150.0000 mg | ORAL_TABLET | Freq: Once | ORAL | 0 refills | Status: AC
Start: 2020-05-18 — End: 2020-05-18

## 2020-05-18 MED ORDER — METRONIDAZOLE 500 MG PO TABS
500.0000 mg | ORAL_TABLET | Freq: Two times a day (BID) | ORAL | 0 refills | Status: DC
Start: 2020-05-18 — End: 2020-07-30

## 2020-05-18 NOTE — Telephone Encounter (Signed)
Patient calling for results and would like to be treated for the yeast and bacterial vaginosis. Patient given scripts per protocol. Armandina Stammer RN

## 2020-07-27 ENCOUNTER — Other Ambulatory Visit (HOSPITAL_COMMUNITY)
Admission: RE | Admit: 2020-07-27 | Discharge: 2020-07-27 | Disposition: A | Discharge status: Home / Self Care | Payer: Medicaid Other | Source: Ambulatory Visit | Attending: Obstetrics & Gynecology | Admitting: Obstetrics & Gynecology

## 2020-07-27 ENCOUNTER — Ambulatory Visit: Payer: Medicaid Other

## 2020-07-27 ENCOUNTER — Other Ambulatory Visit: Payer: Self-pay

## 2020-07-27 VITALS — BP 108/72 | HR 80 | Wt 129.0 lb

## 2020-07-27 DIAGNOSIS — N898 Other specified noninflammatory disorders of vagina: Secondary | ICD-10-CM

## 2020-07-27 NOTE — Progress Notes (Addendum)
Patient presents to clinic because she would like to "see if my BV is gone." Last time patient was swabbed at this clinic was 10/20/202. Patient states she noticed odor that began two days ago. No new partners but would like to be checked for GC/CHl.  Will send self swab and return call to patient with results. Armandina Stammer RN   Attestation of Attending Supervision of RN: Evaluation and management procedures were performed by the nurse under my supervision and collaboration.  I have reviewed the nursing note and chart, and I agree with the management and plan.  Carolyn L. Harraway-Smith, M.D., [facility]

## 2020-07-29 LAB — CERVICOVAGINAL ANCILLARY ONLY
Bacterial Vaginitis (gardnerella): POSITIVE — AB
Candida Glabrata: NEGATIVE
Candida Vaginitis: NEGATIVE
Chlamydia: NEGATIVE
Comment: NEGATIVE
Comment: NEGATIVE
Comment: NEGATIVE
Comment: NEGATIVE
Comment: NEGATIVE
Comment: NORMAL
Neisseria Gonorrhea: NEGATIVE
Trichomonas: NEGATIVE

## 2020-07-30 ENCOUNTER — Telehealth: Payer: Self-pay

## 2020-07-30 DIAGNOSIS — B9689 Other specified bacterial agents as the cause of diseases classified elsewhere: Secondary | ICD-10-CM

## 2020-07-30 MED ORDER — METRONIDAZOLE 500 MG PO TABS: 500.0000 mg | ORAL_TABLET | Freq: Two times a day (BID) | ORAL | 0 refills | Status: DC

## 2020-07-30 NOTE — Telephone Encounter (Signed)
Called pt to discuss positive BV results. Advised  Not to drink alcohol with medication as it can make her sick. Flagyl 500 mg BID x 7 days was sent to her pharmacy. Understanding was voiced. Netty Sullivant l Amillya Chavira, CMA

## 2020-08-02 ENCOUNTER — Telehealth: Payer: Self-pay

## 2020-08-02 DIAGNOSIS — B9689 Other specified bacterial agents as the cause of diseases classified elsewhere: Secondary | ICD-10-CM

## 2020-08-02 MED ORDER — METRONIDAZOLE 0.75 % VA GEL: VAGINAL | 0 refills | Status: DC

## 2020-08-02 MED ORDER — METRONIDAZOLE 0.75 % VA GEL
VAGINAL | 0 refills | Status: DC
Start: 2020-08-02 — End: 2020-08-02

## 2020-08-02 NOTE — Telephone Encounter (Signed)
Pt called stating every time she takes Flagyl she throws it up. Metrogel was sent to her pharmacy.  Understanding was voiced. Jerie Basford l Tywanna Seifer, CMA

## 2020-08-04 ENCOUNTER — Other Ambulatory Visit: Payer: Self-pay | Admitting: Obstetrics & Gynecology

## 2020-09-14 ENCOUNTER — Ambulatory Visit: Payer: Medicaid Other

## 2020-09-17 ENCOUNTER — Other Ambulatory Visit: Payer: Self-pay

## 2020-09-17 ENCOUNTER — Other Ambulatory Visit (HOSPITAL_COMMUNITY)
Admission: RE | Admit: 2020-09-17 | Discharge: 2020-09-17 | Disposition: A | Discharge status: Home / Self Care | Payer: Medicaid Other | Source: Ambulatory Visit | Attending: Obstetrics & Gynecology | Admitting: Obstetrics & Gynecology

## 2020-09-17 ENCOUNTER — Ambulatory Visit: Payer: Medicaid Other

## 2020-09-17 VITALS — BP 110/76 | HR 82 | Wt 138.0 lb

## 2020-09-17 DIAGNOSIS — N898 Other specified noninflammatory disorders of vagina: Secondary | ICD-10-CM | POA: Insufficient documentation

## 2020-09-17 DIAGNOSIS — B9689 Other specified bacterial agents as the cause of diseases classified elsewhere: Secondary | ICD-10-CM | POA: Insufficient documentation

## 2020-09-17 DIAGNOSIS — N760 Acute vaginitis: Secondary | ICD-10-CM | POA: Insufficient documentation

## 2020-09-17 NOTE — Progress Notes (Signed)
SUBJECTIVE:  25 y.o. female presents for recurrent BV. Pt states she is having some discharge but no odor.  No LMP recorded. (Menstrual status: IUD).  OBJECTIVE:  She appears well, afebrile. Urine dipstick: not done.  ASSESSMENT:  Vaginal discharge  No vaginal odor   PLAN: Wet Prep was sent to the lab.

## 2020-09-20 LAB — CERVICOVAGINAL ANCILLARY ONLY
Bacterial Vaginitis (gardnerella): POSITIVE — AB
Candida Glabrata: NEGATIVE
Candida Vaginitis: NEGATIVE
Comment: NEGATIVE
Comment: NEGATIVE
Comment: NEGATIVE

## 2020-09-21 ENCOUNTER — Other Ambulatory Visit: Payer: Self-pay

## 2020-09-21 DIAGNOSIS — B9689 Other specified bacterial agents as the cause of diseases classified elsewhere: Secondary | ICD-10-CM

## 2020-09-21 MED ORDER — METRONIDAZOLE 0.75 % VA GEL: VAGINAL | 0 refills | Status: DC

## 2020-09-21 NOTE — Progress Notes (Signed)
Called pt to discuss BV results. Pt requested Metrogel instead of tablets. Medication was sent to her pharmacy.  Lauren Hanna l Aliahna Statzer, CMA

## 2020-09-28 ENCOUNTER — Other Ambulatory Visit: Payer: Self-pay | Admitting: Obstetrics & Gynecology

## 2020-11-08 ENCOUNTER — Other Ambulatory Visit: Payer: Self-pay

## 2020-11-08 ENCOUNTER — Ambulatory Visit: Payer: Medicaid Other

## 2020-11-08 ENCOUNTER — Other Ambulatory Visit (HOSPITAL_COMMUNITY)
Admission: RE | Admit: 2020-11-08 | Discharge: 2020-11-08 | Disposition: A | Discharge status: Home / Self Care | Payer: Medicaid Other | Source: Ambulatory Visit | Attending: Obstetrics & Gynecology | Admitting: Obstetrics & Gynecology

## 2020-11-08 VITALS — BP 106/73 | HR 87 | Ht 66.0 in | Wt 142.0 lb

## 2020-11-08 DIAGNOSIS — N898 Other specified noninflammatory disorders of vagina: Secondary | ICD-10-CM | POA: Diagnosis not present

## 2020-11-10 ENCOUNTER — Telehealth: Payer: Self-pay

## 2020-11-10 ENCOUNTER — Other Ambulatory Visit: Payer: Self-pay

## 2020-11-10 ENCOUNTER — Ambulatory Visit (INDEPENDENT_AMBULATORY_CARE_PROVIDER_SITE_OTHER): Payer: Medicaid Other

## 2020-11-10 DIAGNOSIS — A549 Gonococcal infection, unspecified: Secondary | ICD-10-CM | POA: Diagnosis not present

## 2020-11-10 LAB — CERVICOVAGINAL ANCILLARY ONLY
Bacterial Vaginitis (gardnerella): POSITIVE — AB
Candida Glabrata: NEGATIVE
Candida Vaginitis: NEGATIVE
Chlamydia: NEGATIVE
Comment: NEGATIVE
Comment: NEGATIVE
Comment: NEGATIVE
Comment: NEGATIVE
Comment: NEGATIVE
Comment: NORMAL
Neisseria Gonorrhea: POSITIVE — AB
Trichomonas: NEGATIVE

## 2020-11-10 MED ORDER — METRONIDAZOLE 0.75 % VA GEL: 1.0000 | Freq: Two times a day (BID) | VAGINAL | 0 refills | Status: DC

## 2020-11-10 NOTE — Telephone Encounter (Signed)
Patient made aware of bacerial vaginosis and gonorrhea on her swabs.  Patient said her partner tested positive and was treated.  Patient also complaining that she has had recurrent bv. Explained to patient that the gonerrhea may be making her have reoccuring BV. Patient states understanding and scheduled for nurse visit today. Armandina Stammer RN

## 2020-11-10 NOTE — Progress Notes (Signed)
Chart reviewed - agree with CMA/RN documentation.  ° °

## 2020-11-10 NOTE — Progress Notes (Signed)
Patient presents for treatment of gonorrhea. Patient made aware she should abstain from intercourse until we bring her back for a test of cure. Patient states understanding and explained that her boyfriend was positive and was treated and they dont understand how she got it.   Patient scheduled for TOC in four weeks.   Armandina Stammer RN

## 2020-11-21 ENCOUNTER — Other Ambulatory Visit: Payer: Self-pay

## 2020-11-21 ENCOUNTER — Emergency Department (HOSPITAL_COMMUNITY)
Admission: EM | Admit: 2020-11-21 | Discharge: 2020-11-21 | Discharge status: Against Medical Advice | Payer: Medicaid Other

## 2020-11-21 ENCOUNTER — Emergency Department
Admission: EM | Admit: 2020-11-21 | Discharge: 2020-11-21 | Disposition: A | Discharge status: Home / Self Care | Payer: Medicaid Other | Attending: Emergency Medicine | Admitting: Emergency Medicine

## 2020-11-21 DIAGNOSIS — R197 Diarrhea, unspecified: Secondary | ICD-10-CM | POA: Diagnosis not present

## 2020-11-21 DIAGNOSIS — R109 Unspecified abdominal pain: Secondary | ICD-10-CM | POA: Insufficient documentation

## 2020-11-21 DIAGNOSIS — R112 Nausea with vomiting, unspecified: Secondary | ICD-10-CM | POA: Diagnosis not present

## 2020-11-21 LAB — COMPREHENSIVE METABOLIC PANEL
ALT: 19 U/L (ref 0–44)
AST: 24 U/L (ref 15–41)
Albumin: 5 g/dL (ref 3.5–5.0)
Alkaline Phosphatase: 55 U/L (ref 38–126)
Anion gap: 12 (ref 5–15)
BUN: 10 mg/dL (ref 6–20)
CO2: 25 mmol/L (ref 22–32)
Calcium: 9.6 mg/dL (ref 8.9–10.3)
Chloride: 104 mmol/L (ref 98–111)
Creatinine, Ser: 0.7 mg/dL (ref 0.44–1.00)
GFR, Estimated: >60 mL/min (ref >60)
Glucose, Bld: 119 mg/dL — ABNORMAL HIGH (ref 70–99)
Potassium: 3.4 mmol/L — ABNORMAL LOW (ref 3.5–5.1)
Sodium: 141 mmol/L (ref 135–145)
Total Bilirubin: 0.9 mg/dL (ref 0.3–1.2)
Total Protein: 8.9 g/dL — ABNORMAL HIGH (ref 6.5–8.1)

## 2020-11-21 LAB — URINALYSIS, COMPLETE (UACMP) WITH MICROSCOPIC
Bacteria, UA: NONE SEEN
Bilirubin Urine: NEGATIVE
Glucose, UA: NEGATIVE mg/dL
Hgb urine dipstick: NEGATIVE
Ketones, ur: 20 mg/dL — AB
Leukocytes,Ua: NEGATIVE
Nitrite: NEGATIVE
Protein, ur: >=300 mg/dL — AB
Specific Gravity, Urine: 1.028 (ref 1.005–1.030)
pH: 9 — ABNORMAL HIGH (ref 5.0–8.0)

## 2020-11-21 LAB — CBC
HCT: 37.4 % (ref 36.0–46.0)
Hemoglobin: 13.4 g/dL (ref 12.0–15.0)
MCH: 32.1 pg (ref 26.0–34.0)
MCHC: 35.8 g/dL (ref 30.0–36.0)
MCV: 89.7 fL (ref 80.0–100.0)
Platelets: 287 10*3/uL (ref 150–400)
RBC: 4.17 MIL/uL (ref 3.87–5.11)
RDW: 12.1 % (ref 11.5–15.5)
WBC: 12.5 10*3/uL — ABNORMAL HIGH (ref 4.0–10.5)
nRBC: 0 % (ref 0.0–0.2)

## 2020-11-21 LAB — POC URINE PREG, ED: Preg Test, Ur: NEGATIVE

## 2020-11-21 LAB — LIPASE, BLOOD: Lipase: 22 U/L (ref 11–51)

## 2020-11-21 MED ORDER — POTASSIUM CHLORIDE CRYS ER 20 MEQ PO TBCR
40.0000 meq | EXTENDED_RELEASE_TABLET | Freq: Once | ORAL | Status: AC
  Administered 2020-11-21: 40 meq via ORAL
  Filled 2020-11-21: qty 2

## 2020-11-21 MED ORDER — SODIUM CHLORIDE 0.9 % IV BOLUS
1000.0000 mL | Freq: Once | INTRAVENOUS | Status: AC
  Administered 2020-11-21: 1000 mL via INTRAVENOUS

## 2020-11-21 MED ORDER — ONDANSETRON 4 MG PO TBDP: 4.0000 mg | ORAL_TABLET | Freq: Three times a day (TID) | ORAL | 0 refills | Status: DC | PRN

## 2020-11-21 MED ORDER — HALOPERIDOL LACTATE 5 MG/ML IJ SOLN
2.0000 mg | Freq: Once | INTRAMUSCULAR | Status: AC
  Administered 2020-11-21: 2 mg via INTRAVENOUS
  Filled 2020-11-21: qty 1

## 2020-11-21 NOTE — ED Notes (Signed)
Pt resting in bed, appears comfortable. States symptoms have resolved and she is feeling better, patient given ginger ale for PO challenge. Will reassess.

## 2020-11-21 NOTE — ED Notes (Signed)
Pt left waiting room.  Stated "couldn't wait any longer"

## 2020-11-21 NOTE — ED Provider Notes (Signed)
[facility] Emergency Department Provider Note  ____________________________________________   Event Date/Time   First MD Initiated Contact with Patient 11/21/20 1840     (approximate)  I have reviewed the triage vital signs and the nursing notes.   HISTORY  Chief Complaint Emesis    HPI Lauren Hanna is a 25 y.o. female otherwise healthy comes in with nausea and vomiting.  Patient ports drinking a lot of alcohol on Friday and then Saturday waking up with nausea, vomiting and diarrhea.  She states that she thinks she had alcohol poisoning.  She states that her symptoms have been continuing, nothing makes them better.  She has not been taking any home medication.  Nothing makes them worse.  She does report some mild abdominal cramping secondary to the vomiting.  Patient comes in today for her continued symptoms.  Denies anyone else being sick at home.          Past Medical History:  Diagnosis Date  . Medical history non-contributory     Patient Active Problem List   Diagnosis Date Noted  . Anemia, antepartum 03/28/2019  . Open fracture of nasal bone with routine healing 06/26/2016    History reviewed. No pertinent surgical history.  Prior to Admission medications   Medication Sig Start Date End Date Taking? Authorizing Provider  metroNIDAZOLE (METROGEL VAGINAL) 0.75 % vaginal gel Place 1 Applicatorful vaginally 2 (two) times daily. 11/10/20   Levie Heritage, DO  metroNIDAZOLE (METROGEL) 0.75 % vaginal gel Place 1 Applicatorful vaginally 2 (two) times daily.    [provider]    Allergies Patient has no known allergies.  Family History  Problem Relation Age of Onset  . Healthy Mother   . Healthy Father     Social History Social History   Tobacco Use  . Smoking status: Never Smoker  . Smokeless tobacco: Never Used  Vaping Use  . Vaping Use: Never used  Substance Use Topics  . Alcohol use: Not Currently    Comment:  not while preg  . Drug use: No      Review of Systems Constitutional: No fever/chills Eyes: No visual changes. ENT: No sore throat. Cardiovascular: Denies chest pain. Respiratory: Denies shortness of breath. Gastrointestinal: Positive abdominal pain, nausea, vomiting, diarrhea Genitourinary: Negative for dysuria. Musculoskeletal: Negative for back pain. Skin: Negative for rash. Neurological: Negative for headaches, focal weakness or numbness. All other ROS negative ____________________________________________   PHYSICAL EXAM:  VITAL SIGNS: ED Triage Vitals  Enc Vitals Group     BP 11/21/20 1838 120/83     Pulse Rate 11/21/20 1838 65     Resp 11/21/20 1838 16     Temp 11/21/20 1838 97.9 F (36.6 C)     Temp Source 11/21/20 1838 Oral     SpO2 11/21/20 1838 100 %     Weight 11/21/20 1839 146 lb (66.2 kg)     Height 11/21/20 1839 5\' 6"  (1.676 m)     Head Circumference --      Peak Flow --      Pain Score 11/21/20 1839 0     Pain Loc --      Pain Edu? --      Excl. in GC? --     Constitutional: Alert and oriented. Well appearing and in no acute distress. Eyes: Conjunctivae are normal. EOMI. Head: Atraumatic. Nose: No congestion/rhinnorhea. Mouth/Throat: Mucous membranes are moist.   Neck: No stridor. Trachea Midline. FROM Cardiovascular: Normal rate, regular rhythm. Grossly normal  heart sounds.  Good peripheral circulation. Respiratory: Normal respiratory effort.  No retractions. Lungs CTAB. Gastrointestinal: Soft and nontender. No distention. No abdominal bruits.  Musculoskeletal: No lower extremity tenderness nor edema.  No joint effusions. Neurologic:  Normal speech and language. No gross focal neurologic deficits are appreciated.  Skin:  Skin is warm, dry and intact. No rash noted. Psychiatric: Mood and affect are normal. Speech and behavior are normal. GU: Deferred   ____________________________________________   LABS (all labs ordered are listed, but only  abnormal results are displayed)  Labs Reviewed  COMPREHENSIVE METABOLIC PANEL - Abnormal; Notable for the following components:      Result Value   Potassium 3.4 (*)    Glucose, Bld 119 (*)    Total Protein 8.9 (*)    All other components within normal limits  CBC - Abnormal; Notable for the following components:   WBC 12.5 (*)    All other components within normal limits  URINALYSIS, COMPLETE (UACMP) WITH MICROSCOPIC - Abnormal; Notable for the following components:   Color, Urine YELLOW (*)    APPearance HAZY (*)    pH 9.0 (*)    Ketones, ur 20 (*)    Protein, ur >=300 (*)    All other components within normal limits  POC URINE PREG, ED - Normal  LIPASE, BLOOD   ____________________________________________      PROCEDURES  Procedure(s) performed (including Critical Care):  Procedures   ____________________________________________   INITIAL IMPRESSION / ASSESSMENT AND PLAN / ED COURSE  Lauren Hanna was evaluated in Emergency Department on 11/21/2020 for the symptoms described in the history of present illness. She was evaluated in the context of the global COVID-19 pandemic, which necessitated consideration that the patient might be at risk for infection with the SARS-CoV-2 virus that causes COVID-19. Institutional protocols and algorithms that pertain to the evaluation of patients at risk for COVID-19 are in a state of rapid change based on information released by regulatory bodies including the CDC and federal and state organizations. These policies and algorithms were followed during the patient's care in the ED.    Patient is a 25 year old who comes in with nausea vomiting diarrhea after alcohol binge on Friday.  This could be secondary to alcohol use versus GI bug.  Will get labs to evaluate for Electra abnormalities, AKI, pancreatitis, liver dysfunction, pregnancy.  Patient reports a little bit of abdominal tenderness but her abdominal exam is reassuring and no  evidence of peritonitis.  We will give some IV fluids and dose of Haldol and reassess  Pregnancy test is negative.  Her lipase is normal.  Labs are reassuring except for slightly low potassium which will give some oral repletion 4.  White count slightly elevated 12.5.  I reevaluated patient her abdomen at this time is soft and nontender.  Patient denies any symptoms anymore.  We discussed imaging versus going home and patient is feeling much better and would like to go home.  She understands that if her symptoms are returning she develops any abdominal pain she can return to the ER for repeat evaluation  Given the protein in her urine I will have her follow-up for a recheck with her primary care doctor.  She may need referral to nephrology if she continues to have significant protein in her urine.  I discussed the provisional nature of ED diagnosis, the treatment so far, the ongoing plan of care, follow up appointments and return precautions with the patient and any family or support people  present. They expressed understanding and agreed with the plan, discharged home.   ____________________________________________   FINAL CLINICAL IMPRESSION(S) / ED DIAGNOSES   Final diagnoses:  Nausea vomiting and diarrhea      MEDICATIONS GIVEN DURING THIS VISIT:  Medications  potassium chloride SA (KLOR-CON) CR tablet 40 mEq (has no administration in time range)  sodium chloride 0.9 % bolus 1,000 mL (0 mLs Intravenous Stopped 11/21/20 2030)  haloperidol lactate (HALDOL) injection 2 mg (2 mg Intravenous Given 11/21/20 1922)     ED Discharge Orders         Ordered    ondansetron (ZOFRAN ODT) 4 MG disintegrating tablet  Every 8 hours PRN        11/21/20 2121           Note:  This document was prepared using Dragon voice recognition software and may include unintentional dictation errors.   Concha Se, MD 11/21/20 2121

## 2020-11-21 NOTE — ED Triage Notes (Signed)
Pt states she was drinking 2 days ago and has n/v since- pt states can't keep anything down- pt vomiting in lobby- pt went to [place] earlier and left d/t long wait

## 2020-11-21 NOTE — Discharge Instructions (Addendum)
Take Zofran to help with symptoms.  Stay well-hydrated drinking Pedialyte or Gatorade without sugar and it.  Return to ER if develop abdominal pain for reevaluation.  Avoid alcohol   Your urine had a lot of protein in it.  Follow-up with his recheck with your primary care doctor.

## 2020-11-22 ENCOUNTER — Telehealth: Payer: Self-pay

## 2020-11-22 NOTE — Telephone Encounter (Signed)
Transition Care Management Unsuccessful Follow-up Telephone Call  Date of discharge and from where:  11/21/2020 from [facility].   Attempts:  1st Attempt  Reason for unsuccessful TCM follow-up call:  Left voice message

## 2020-11-23 NOTE — Telephone Encounter (Signed)
Transition Care Management Unsuccessful Follow-up Telephone Call  Date of discharge and from where:  11/21/2020 from [facility]  Attempts:  2nd Attempt  Reason for unsuccessful TCM follow-up call:  Left voice message

## 2020-11-24 NOTE — Telephone Encounter (Signed)
Transition Care Management Unsuccessful Follow-up Telephone Call  Date of discharge and from where:  11/21/2020 from [facility]   Attempts:  3rd Attempt  Reason for unsuccessful TCM follow-up call:  Unable to reach patient

## 2020-12-06 ENCOUNTER — Other Ambulatory Visit: Payer: Self-pay

## 2020-12-06 ENCOUNTER — Ambulatory Visit: Payer: Medicaid Other

## 2020-12-06 ENCOUNTER — Other Ambulatory Visit (HOSPITAL_COMMUNITY)
Admission: RE | Admit: 2020-12-06 | Discharge: 2020-12-06 | Disposition: A | Discharge status: Home / Self Care | Payer: Medicaid Other | Source: Ambulatory Visit | Attending: Family Medicine | Admitting: Family Medicine

## 2020-12-06 VITALS — BP 102/72 | HR 99 | Wt 143.0 lb

## 2020-12-06 DIAGNOSIS — Z8619 Personal history of other infectious and parasitic diseases: Secondary | ICD-10-CM | POA: Insufficient documentation

## 2020-12-06 DIAGNOSIS — N898 Other specified noninflammatory disorders of vagina: Secondary | ICD-10-CM | POA: Insufficient documentation

## 2020-12-06 NOTE — Progress Notes (Signed)
Patient seen and assessed by nursing staff.  Agree with documentation and plan.  

## 2020-12-06 NOTE — Progress Notes (Signed)
Pt presents for TOC for Gonorrhea. Pt is having vaginal discharge and has a hx of BV and is requesting to be tested for that as well. Self swab was sent to the lab.  Deniyah Dillavou l Adilenne Ashworth, CMA

## 2020-12-08 LAB — CERVICOVAGINAL ANCILLARY ONLY
Bacterial Vaginitis (gardnerella): POSITIVE — AB
Chlamydia: NEGATIVE
Comment: NEGATIVE
Comment: NEGATIVE
Comment: NORMAL
Neisseria Gonorrhea: NEGATIVE

## 2020-12-08 MED ORDER — METRONIDAZOLE 500 MG PO TABS: 500.0000 mg | ORAL_TABLET | Freq: Two times a day (BID) | ORAL | 0 refills | Status: DC

## 2020-12-08 NOTE — Addendum Note (Signed)
Addended by: Reva Bores on: 12/08/2020 03:47 PM   Modules accepted: Orders

## 2020-12-23 ENCOUNTER — Encounter: Payer: Self-pay | Admitting: Family Medicine

## 2020-12-23 ENCOUNTER — Other Ambulatory Visit: Payer: Self-pay

## 2020-12-23 ENCOUNTER — Ambulatory Visit (INDEPENDENT_AMBULATORY_CARE_PROVIDER_SITE_OTHER): Payer: Medicaid Other | Admitting: Family Medicine

## 2020-12-23 VITALS — BP 105/82 | HR 75 | Wt 141.0 lb

## 2020-12-23 DIAGNOSIS — N760 Acute vaginitis: Secondary | ICD-10-CM | POA: Diagnosis not present

## 2020-12-23 DIAGNOSIS — B9689 Other specified bacterial agents as the cause of diseases classified elsewhere: Secondary | ICD-10-CM

## 2020-12-23 MED ORDER — BORIC ACID VAGINAL 600 MG VA SUPP: 1.0000 | Freq: Every day | VAGINAL | 3 refills | Status: DC

## 2020-12-23 NOTE — Progress Notes (Signed)
   Subjective:    Patient ID: Lauren Hanna, female    DOB: 01-May-1996, 25 y.o.   MRN: [id number]  HPI  Patient seen for recurrent BV. Has IUD in place and had BV every month for the past 4-5 months. IUD placed 2 years ago.  Review of Systems     Objective:   Physical Exam Vitals reviewed.  Constitutional:      Appearance: Normal appearance.  Abdominal:     General: Abdomen is flat.     Palpations: Abdomen is soft.  Neurological:     Mental Status: She is alert.  Psychiatric:        Mood and Affect: Mood normal.        Behavior: Behavior normal.        Thought Content: Thought content normal.        Judgment: Judgment normal.       Assessment & Plan:  1. BV (bacterial vaginosis) Start boric acid nightly for 2 weeks then 3x a week. Call with symptoms. If has symptoms, then will put on metrogel for suppression after treatment.

## 2021-01-09 ENCOUNTER — Ambulatory Visit (HOSPITAL_COMMUNITY): Payer: Self-pay

## 2021-01-09 ENCOUNTER — Ambulatory Visit (HOSPITAL_COMMUNITY)
Admission: EM | Admit: 2021-01-09 | Discharge: 2021-01-09 | Disposition: A | Discharge status: Home / Self Care | Payer: Medicaid Other | Attending: Emergency Medicine | Admitting: Emergency Medicine

## 2021-01-09 ENCOUNTER — Encounter (HOSPITAL_COMMUNITY): Payer: Self-pay

## 2021-01-09 DIAGNOSIS — N898 Other specified noninflammatory disorders of vagina: Secondary | ICD-10-CM | POA: Diagnosis not present

## 2021-01-09 NOTE — Discharge Instructions (Addendum)
Please wait for your wet prep results to post to MyChart. If wet prep is concerning for BV, we will start a management plan for you.

## 2021-01-09 NOTE — ED Provider Notes (Signed)
[facility]  ____________________________________________  Time seen: Approximately 12:33 PM  I have reviewed the triage vital signs and the nursing notes.   HISTORY  Chief Complaint SEXUALLY TRANSMITTED DISEASE   Historian Patient     HPI Lauren Hanna is a 25 y.o. female presents to the urgent care with changes in vaginal odor for the past 2 days.  Patient reports that she has had malodorous vaginal discharge intermittently for the past 2 years since having her IUD placed.  Patient denies changes in vaginal discharge, vaginal pruritus, dysuria, hematuria, increased urinary frequency, low back pain or possibility of pregnancy.  Patient states that she has tried Flagyl, Flagyl gel and Clindagel in the past which only temporarily relieve her symptoms.   Past Medical History:  Diagnosis Date   Medical history non-contributory      Immunizations up to date:  Yes.     Past Medical History:  Diagnosis Date   Medical history non-contributory     Patient Active Problem List   Diagnosis Date Noted   Anemia, antepartum 03/28/2019   Open fracture of nasal bone with routine healing 06/26/2016    History reviewed. No pertinent surgical history.  Prior to Admission medications   Medication Sig Start Date End Date Taking? Authorizing Provider  Boric Acid Vaginal 600 MG SUPP Place 1 capsule vaginally at bedtime. Nightly for 2 weeks then three times a week 12/23/20   Levie Heritage, DO  levonorgestrel (LILETTA, 52 MG,) 20.1 MCG/DAY IUD 1 each by Intrauterine route once.    [provider]  metroNIDAZOLE (FLAGYL) 500 MG tablet Take 1 tablet (500 mg total) by mouth 2 (two) times daily. Patient not taking: Reported on 12/23/2020 12/08/20   Reva Bores, MD  metroNIDAZOLE (METROGEL VAGINAL) 0.75 % vaginal gel Place 1 Applicatorful vaginally 2 (two) times daily. Patient not taking: No sig reported 11/10/20   Levie Heritage, DO  metroNIDAZOLE (METROGEL) 0.75  % vaginal gel Place 1 Applicatorful vaginally 2 (two) times daily. Patient not taking: No sig reported    [provider]  ondansetron (ZOFRAN ODT) 4 MG disintegrating tablet Take 1 tablet (4 mg total) by mouth every 8 (eight) hours as needed for nausea or vomiting. Patient not taking: No sig reported 11/21/20   Concha Se, MD    Allergies Patient has no known allergies.  Family History  Problem Relation Age of Onset   Healthy Mother    Healthy Father     Social History Social History   Tobacco Use   Smoking status: Never   Smokeless tobacco: Never  Vaping Use   Vaping Use: Never used  Substance Use Topics   Alcohol use: Not Currently    Comment: not while preg   Drug use: No     Review of Systems  Constitutional: No fever/chills Eyes:  No discharge ENT: No upper respiratory complaints. Respiratory: no cough. No SOB/ use of accessory muscles to breath Gastrointestinal:   No nausea, no vomiting.  No diarrhea.  No constipation. Genitourinary: Patient has malodorous vaginal discharge. Musculoskeletal: Negative for musculoskeletal pain. Skin: Negative for rash, abrasions, lacerations, ecchymosis.   ____________________________________________   PHYSICAL EXAM:  VITAL SIGNS: ED Triage Vitals  Enc Vitals Group     BP 01/09/21 1211 100/75     Pulse Rate 01/09/21 1211 70     Resp 01/09/21 1211 18     Temp 01/09/21 1211 99.1 F (37.3 C)     Temp Source 01/09/21 1211 Oral  SpO2 01/09/21 1211 100 %     Weight --      Height --      Head Circumference --      Peak Flow --      Pain Score 01/09/21 1210 0     Pain Loc --      Pain Edu? --      Excl. in GC? --      Constitutional: Alert and oriented. Well appearing and in no acute distress. Eyes: Conjunctivae are normal. PERRL. EOMI. Head: Atraumatic. ENT: Cardiovascular: Normal rate, regular rhythm. Normal S1 and S2.  Good peripheral circulation. Respiratory: Normal respiratory effort without  tachypnea or retractions. Lungs CTAB. Good air entry to the bases with no decreased or absent breath sounds Gastrointestinal: Bowel sounds x 4 quadrants. Soft and nontender to palpation. No guarding or rigidity. No distention. Musculoskeletal: Full range of motion to all extremities. No obvious deformities noted Neurologic:  Normal for age. No gross focal neurologic deficits are appreciated.  Skin:  Skin is warm, dry and intact. No rash noted. Psychiatric: Mood and affect are normal for age. Speech and behavior are normal.   ____________________________________________   LABS (all labs ordered are listed, but only abnormal results are displayed)  Labs Reviewed  CERVICOVAGINAL ANCILLARY ONLY   ____________________________________________  EKG   ____________________________________________  RADIOLOGY   No results found.  ____________________________________________    PROCEDURES  Procedure(s) performed:     Procedures     Medications - No data to display   ____________________________________________   INITIAL IMPRESSION / ASSESSMENT AND PLAN / ED COURSE  Pertinent labs & imaging results that were available during my care of the patient were reviewed by me and considered in my medical decision making (see chart for details).      Assessment and plan Changes in vaginal discharge 25 year old female presents to the urgent care with malodorous vaginal discharge for the past 2 days.  Wet prep is in process at this time.  Will await results before initiating treatment as patient has been treated multiple times in the past for BV.  I do not see recent treatment with Clindagel in patient's chart.  We will consider treating with Clindagel if wet prep is positive with clue cells.     ____________________________________________  FINAL CLINICAL IMPRESSION(S) / ED DIAGNOSES  Final diagnoses:  Vaginal odor      NEW MEDICATIONS STARTED DURING THIS VISIT:  ED  Discharge Orders     None           This chart was dictated using voice recognition software/Dragon. Despite best efforts to proofread, errors can occur which can change the meaning. Any change was purely unintentional.     Orvil Feil, PA-C 01/09/21 1237

## 2021-01-09 NOTE — ED Triage Notes (Signed)
Pt presents with vaginal odor X 2 days. Pt denies vaginal discharge and itching.

## 2021-01-11 LAB — CERVICOVAGINAL ANCILLARY ONLY
Bacterial Vaginitis (gardnerella): POSITIVE — AB
Candida Glabrata: NEGATIVE
Candida Vaginitis: NEGATIVE
Chlamydia: NEGATIVE
Comment: NEGATIVE
Comment: NEGATIVE
Comment: NEGATIVE
Comment: NEGATIVE
Comment: NEGATIVE
Comment: NORMAL
Neisseria Gonorrhea: NEGATIVE
Trichomonas: NEGATIVE

## 2021-01-14 ENCOUNTER — Telehealth (HOSPITAL_COMMUNITY): Payer: Self-pay | Admitting: Emergency Medicine

## 2021-01-14 MED ORDER — CLINDAMYCIN HCL 150 MG PO CAPS: 300.0000 mg | ORAL_CAPSULE | Freq: Two times a day (BID) | ORAL | 0 refills | Status: AC

## 2021-02-01 ENCOUNTER — Telehealth: Payer: Self-pay | Admitting: General Practice

## 2021-02-01 NOTE — Telephone Encounter (Signed)
Called patient to schedule 3 month follow up with Dr. Adrian Blackwater in September.  Hoever, pt is due for Annual Exam.  Left message on VM asking pt to contact our office to schedule appt.

## 2021-02-10 ENCOUNTER — Other Ambulatory Visit (HOSPITAL_COMMUNITY)
Admission: RE | Admit: 2021-02-10 | Discharge: 2021-02-10 | Disposition: A | Discharge status: Home / Self Care | Payer: Medicaid Other | Source: Ambulatory Visit | Attending: Obstetrics & Gynecology | Admitting: Obstetrics & Gynecology

## 2021-02-10 ENCOUNTER — Ambulatory Visit: Payer: Medicaid Other

## 2021-02-10 ENCOUNTER — Other Ambulatory Visit: Payer: Self-pay

## 2021-02-10 ENCOUNTER — Ambulatory Visit (INDEPENDENT_AMBULATORY_CARE_PROVIDER_SITE_OTHER): Payer: Medicaid Other

## 2021-02-10 VITALS — BP 105/66 | HR 72

## 2021-02-10 DIAGNOSIS — Z202 Contact with and (suspected) exposure to infections with a predominantly sexual mode of transmission: Secondary | ICD-10-CM

## 2021-02-10 DIAGNOSIS — Z113 Encounter for screening for infections with a predominantly sexual mode of transmission: Secondary | ICD-10-CM

## 2021-02-10 NOTE — Progress Notes (Signed)
SUBJECTIVE:  25 y.o. female complains of clear vaginal discharge for a couple of days. Denies abnormal vaginal bleeding or significant pelvic pain or fever. No UTI symptoms. Denies history of known exposure to STD.  No LMP recorded. (Menstrual status: IUD).  OBJECTIVE:  She appears well, afebrile. Urine dipstick: not done.  ASSESSMENT:  Vaginal Discharge: none  Vaginal Odor; none    PLAN:  GC, chlamydia, trichomonas, BVAG, CVAG probe sent to lab. Treatment: To be determined once lab results are received ROV prn if symptoms persist or worsen.

## 2021-02-11 ENCOUNTER — Other Ambulatory Visit: Payer: Self-pay | Admitting: Family Medicine

## 2021-02-11 LAB — CERVICOVAGINAL ANCILLARY ONLY
Bacterial Vaginitis (gardnerella): POSITIVE — AB
Candida Glabrata: NEGATIVE
Candida Vaginitis: NEGATIVE
Chlamydia: NEGATIVE
Comment: NEGATIVE
Comment: NEGATIVE
Comment: NEGATIVE
Comment: NEGATIVE
Comment: NEGATIVE
Comment: NORMAL
Neisseria Gonorrhea: NEGATIVE
Trichomonas: NEGATIVE

## 2021-02-11 MED ORDER — METRONIDAZOLE 500 MG PO TABS: 500.0000 mg | ORAL_TABLET | Freq: Two times a day (BID) | ORAL | 0 refills | Status: DC

## 2021-02-11 MED ORDER — METRONIDAZOLE 0.75 % VA GEL: 1.0000 | Freq: Two times a day (BID) | VAGINAL | 1 refills | Status: DC

## 2021-02-17 DIAGNOSIS — Z113 Encounter for screening for infections with a predominantly sexual mode of transmission: Secondary | ICD-10-CM | POA: Diagnosis not present

## 2021-03-13 ENCOUNTER — Encounter: Payer: Self-pay | Admitting: Emergency Medicine

## 2021-03-13 ENCOUNTER — Other Ambulatory Visit: Payer: Self-pay

## 2021-03-13 ENCOUNTER — Emergency Department: Payer: Medicaid Other

## 2021-03-13 ENCOUNTER — Emergency Department
Admission: EM | Admit: 2021-03-13 | Discharge: 2021-03-13 | Disposition: A | Discharge status: Home / Self Care | Payer: Medicaid Other | Attending: Emergency Medicine | Admitting: Emergency Medicine

## 2021-03-13 DIAGNOSIS — R1030 Lower abdominal pain, unspecified: Secondary | ICD-10-CM | POA: Diagnosis not present

## 2021-03-13 DIAGNOSIS — R112 Nausea with vomiting, unspecified: Secondary | ICD-10-CM | POA: Diagnosis not present

## 2021-03-13 DIAGNOSIS — R1031 Right lower quadrant pain: Secondary | ICD-10-CM | POA: Diagnosis not present

## 2021-03-13 DIAGNOSIS — R109 Unspecified abdominal pain: Secondary | ICD-10-CM | POA: Diagnosis not present

## 2021-03-13 DIAGNOSIS — R9431 Abnormal electrocardiogram [ECG] [EKG]: Secondary | ICD-10-CM | POA: Diagnosis not present

## 2021-03-13 LAB — COMPREHENSIVE METABOLIC PANEL
ALT: 16 U/L (ref 0–44)
AST: 21 U/L (ref 15–41)
Albumin: 5.1 g/dL — ABNORMAL HIGH (ref 3.5–5.0)
Alkaline Phosphatase: 57 U/L (ref 38–126)
Anion gap: 12 (ref 5–15)
BUN: 9 mg/dL (ref 6–20)
CO2: 26 mmol/L (ref 22–32)
Calcium: 9.8 mg/dL (ref 8.9–10.3)
Chloride: 104 mmol/L (ref 98–111)
Creatinine, Ser: 0.72 mg/dL (ref 0.44–1.00)
GFR, Estimated: >60 mL/min (ref >60)
Glucose, Bld: 138 mg/dL — ABNORMAL HIGH (ref 70–99)
Potassium: 3.6 mmol/L (ref 3.5–5.1)
Sodium: 142 mmol/L (ref 135–145)
Total Bilirubin: 1.2 mg/dL (ref 0.3–1.2)
Total Protein: 8.9 g/dL — ABNORMAL HIGH (ref 6.5–8.1)

## 2021-03-13 LAB — URINALYSIS, COMPLETE (UACMP) WITH MICROSCOPIC
Bacteria, UA: NONE SEEN
Bilirubin Urine: NEGATIVE
Glucose, UA: NEGATIVE mg/dL
Hgb urine dipstick: NEGATIVE
Ketones, ur: >80 mg/dL — AB
Leukocytes,Ua: NEGATIVE
Nitrite: NEGATIVE
Protein, ur: 100 mg/dL — AB
Specific Gravity, Urine: 1.015 (ref 1.005–1.030)
Squamous Epithelial / HPF: NONE SEEN (ref 0–5)
pH: 8.5 — ABNORMAL HIGH (ref 5.0–8.0)

## 2021-03-13 LAB — CBC
HCT: 39 % (ref 36.0–46.0)
Hemoglobin: 14 g/dL (ref 12.0–15.0)
MCH: 32.9 pg (ref 26.0–34.0)
MCHC: 35.9 g/dL (ref 30.0–36.0)
MCV: 91.8 fL (ref 80.0–100.0)
Platelets: 299 10*3/uL (ref 150–400)
RBC: 4.25 MIL/uL (ref 3.87–5.11)
RDW: 12 % (ref 11.5–15.5)
WBC: 11.1 10*3/uL — ABNORMAL HIGH (ref 4.0–10.5)
nRBC: 0 % (ref 0.0–0.2)

## 2021-03-13 LAB — LIPASE, BLOOD: Lipase: 23 U/L (ref 11–51)

## 2021-03-13 LAB — POC URINE PREG, ED: Preg Test, Ur: NEGATIVE

## 2021-03-13 MED ORDER — ONDANSETRON 4 MG PO TBDP: 4.0000 mg | ORAL_TABLET | Freq: Three times a day (TID) | ORAL | 0 refills | Status: AC | PRN

## 2021-03-13 MED ORDER — ONDANSETRON 8 MG PO TBDP
8.0000 mg | ORAL_TABLET | Freq: Once | ORAL | Status: AC
  Administered 2021-03-13: 8 mg via ORAL
  Filled 2021-03-13: qty 1

## 2021-03-13 MED ORDER — ONDANSETRON 4 MG PO TBDP
4.0000 mg | ORAL_TABLET | Freq: Once | ORAL | Status: AC
  Administered 2021-03-13: 4 mg via ORAL
  Filled 2021-03-13: qty 1

## 2021-03-13 MED ORDER — IOHEXOL 350 MG/ML SOLN
80.0000 mL | Freq: Once | INTRAVENOUS | Status: AC | PRN
  Administered 2021-03-13: 80 mL via INTRAVENOUS
  Filled 2021-03-13: qty 80

## 2021-03-13 NOTE — ED Notes (Signed)
CBC redrawn at this time.

## 2021-03-13 NOTE — ED Provider Notes (Signed)
[facility] Emergency Department Provider Note   ____________________________________________   Event Date/Time   First MD Initiated Contact with Patient 03/13/21 1626     (approximate)  I have reviewed the triage vital signs and the nursing notes.   HISTORY  Chief Complaint Nausea and Emesis    HPI Lauren Hanna is a 25 y.o. female with the below stated past medical history presents for nausea/vomiting  LOCATION: Abdomen DURATION: 2 days prior to arrival TIMING: Stable since onset SEVERITY: Severe QUALITY: Nausea/vomiting CONTEXT: Patient states that she had a heavy night of drinking 2 nights ago and began having nausea/vomiting the subsequent morning however this vomiting has not stopped MODIFYING FACTORS: Any p.o. intake causes vomiting and denies any relieving factors ASSOCIATED SYMPTOMS: Denies any abdominal pain   Per medical record review, past medical history noncontributory          Past Medical History:  Diagnosis Date   Medical history non-contributory     Patient Active Problem List   Diagnosis Date Noted   Anemia, antepartum 03/28/2019   Open fracture of nasal bone with routine healing 06/26/2016    History reviewed. No pertinent surgical history.  Prior to Admission medications   Medication Sig Start Date End Date Taking? Authorizing Provider  ondansetron (ZOFRAN ODT) 4 MG disintegrating tablet Take 1 tablet (4 mg total) by mouth every 8 (eight) hours as needed for up to 3 days for nausea or vomiting. 03/13/21 03/16/21 Yes Shariq Puig, Clent Jacks, MD  Boric Acid Vaginal 600 MG SUPP Place 1 capsule vaginally at bedtime. Nightly for 2 weeks then three times a week 12/23/20   Levie Heritage, DO  levonorgestrel (LILETTA, 52 MG,) 20.1 MCG/DAY IUD 1 each by Intrauterine route once.    [provider]  metroNIDAZOLE (FLAGYL) 500 MG tablet Take 1 tablet (500 mg total) by mouth 2 (two) times daily. 02/11/21   Levie Heritage,  DO  metroNIDAZOLE (METROGEL VAGINAL) 0.75 % vaginal gel Place 1 Applicatorful vaginally 2 (two) times daily. Patient not taking: No sig reported 11/10/20   Levie Heritage, DO  metroNIDAZOLE (METROGEL) 0.75 % vaginal gel Place 1 Applicatorful vaginally 2 (two) times daily. 02/11/21   Levie Heritage, DO  ondansetron (ZOFRAN ODT) 4 MG disintegrating tablet Take 1 tablet (4 mg total) by mouth every 8 (eight) hours as needed for nausea or vomiting. Patient not taking: No sig reported 11/21/20   Concha Se, MD    Allergies Patient has no known allergies.  Family History  Problem Relation Age of Onset   Healthy Mother    Healthy Father     Social History Social History   Tobacco Use   Smoking status: Never   Smokeless tobacco: Never  Vaping Use   Vaping Use: Never used  Substance Use Topics   Alcohol use: Yes    Comment: not while preg   Drug use: No    Review of Systems Constitutional: No fever/chills Eyes: No visual changes. ENT: No sore throat. Cardiovascular: Denies chest pain. Respiratory: Denies shortness of breath. Gastrointestinal: No abdominal pain.  Endorses nausea/vomiting.  No diarrhea. Genitourinary: Negative for dysuria. Musculoskeletal: Negative for acute arthralgias Skin: Negative for rash. Neurological: Negative for headaches, weakness/numbness/paresthesias in any extremity Psychiatric: Negative for suicidal ideation/homicidal ideation   ____________________________________________   PHYSICAL EXAM:  VITAL SIGNS: ED Triage Vitals  Enc Vitals Group     BP 03/13/21 1118 133/89     Pulse Rate 03/13/21 1118 76  Resp 03/13/21 1118 18     Temp 03/13/21 1118 97.6 F (36.4 C)     Temp Source 03/13/21 1118 Oral     SpO2 03/13/21 1118 100 %     Weight 03/13/21 1119 140 lb (63.5 kg)     Height 03/13/21 1119 5\' 6"  (1.676 m)     Head Circumference --      Peak Flow --      Pain Score 03/13/21 1119 10     Pain Loc --      Pain Edu? --      Excl. in  GC? --    Constitutional: Alert and oriented. Well appearing and in no acute distress. Eyes: Conjunctivae are normal. PERRL. Head: Atraumatic. Nose: No congestion/rhinnorhea. Mouth/Throat: Mucous membranes are moist. Neck: No stridor Cardiovascular: Grossly normal heart sounds.  Good peripheral circulation. Respiratory: Normal respiratory effort.  No retractions. Gastrointestinal: Soft and nontender. No distention. Musculoskeletal: No obvious deformities Neurologic:  Normal speech and language. No gross focal neurologic deficits are appreciated. Skin:  Skin is warm and dry. No rash noted. Psychiatric: Mood and affect are normal. Speech and behavior are normal.  ____________________________________________   LABS (all labs ordered are listed, but only abnormal results are displayed)  Labs Reviewed  COMPREHENSIVE METABOLIC PANEL - Abnormal; Notable for the following components:      Result Value   Glucose, Bld 138 (*)    Total Protein 8.9 (*)    Albumin 5.1 (*)    All other components within normal limits  URINALYSIS, COMPLETE (UACMP) WITH MICROSCOPIC - Abnormal; Notable for the following components:   Color, Urine YELLOW (*)    APPearance CLEAR (*)    pH 8.5 (*)    Ketones, ur >80 (*)    Protein, ur 100 (*)    All other components within normal limits  CBC - Abnormal; Notable for the following components:   WBC 11.1 (*)    All other components within normal limits  LIPASE, BLOOD  POC URINE PREG, ED   RADIOLOGY  ED MD interpretation: CT of the abdomen and pelvis with contrast shows a large amount of fluid in the stomach and proximal duodenum with mild proximal duodenal distention and distal duodenal decompression which fits with patient's clinical history of Zofran and heavy p.o. fluid intake prior to CT  Official radiology report(s): CT Abdomen Pelvis W Contrast  Result Date: 03/13/2021 CLINICAL DATA:  Right lower quadrant abdominal pain. Appendicitis suspected. EXAM:  CT ABDOMEN AND PELVIS WITH CONTRAST TECHNIQUE: Multidetector CT imaging of the abdomen and pelvis was performed using the standard protocol following bolus administration of intravenous contrast. CONTRAST:  15mL OMNIPAQUE IOHEXOL 350 MG/ML SOLN COMPARISON:  None. FINDINGS: Lower chest: Lung bases are clear. Hepatobiliary: No focal liver abnormality is seen. No gallstones, gallbladder wall thickening, or biliary dilatation. Pancreas: Unremarkable. No pancreatic ductal dilatation or surrounding inflammatory changes. Spleen: Normal in size without focal abnormality. Adrenals/Urinary Tract: Adrenal glands are unremarkable. Kidneys are normal, without renal calculi, focal lesion, or hydronephrosis. Bladder is unremarkable. Stomach/Bowel: Fluid-filled nondistended stomach. The proximal duodenum is fluid-filled and mildly distended to the level of the superior mesenteric artery. The distal duodenum is decompressed. 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 The remainder of the small bowel and colon are mostly decompressed. Scattered stool in the colon. No wall thickening or inflammatory changes. The appendix is normal. Vascular/Lymphatic: No significant vascular findings are present. No enlarged abdominal or pelvic lymph  nodes. Reproductive: Uterus is retroverted. An intrauterine device is present centrally in the uterus. Ovaries are not enlarged. Involuting cyst in the right ovary. Small amount of free fluid in the pelvis is likely physiologic. Other: No free air in the abdomen. Abdominal wall musculature appears intact. Musculoskeletal: No acute or significant osseous findings. IMPRESSION: 1. Fluid-filled stomach and proximal duodenum with mild proximal duodenal distension and distal duodenal decompression. 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 2. Normal appendix. 3. Intrauterine device present. Electronically Signed   By: Burman Nieves M.D.   On:  03/13/2021 19:07    ____________________________________________   PROCEDURES  Procedure(s) performed (including Critical Care):  .1-3 Lead EKG Interpretation  Date/Time: 03/13/2021 9:52 PM Performed by: Merwyn Katos, MD Authorized by: Merwyn Katos, MD     Interpretation: normal     ECG rate:  61   ECG rate assessment: normal     Rhythm: sinus rhythm     Ectopy: none     Conduction: normal     ____________________________________________   INITIAL IMPRESSION / ASSESSMENT AND PLAN / ED COURSE  As part of my medical decision making, I reviewed the following data within the electronic medical record, if available:  Nursing notes reviewed and incorporated, Labs reviewed, EKG interpreted, Old chart reviewed, Radiograph reviewed and Notes from prior ED visits reviewed and incorporated       Patient presents for acute nausea/vomiting The cause of the patients symptoms is not clear, but the patient is overall well appearing and is suspected to have a transient course of illness.  Given History and Exam there does not appear to be an emergent cause of the symptoms such as small bowel obstruction, coronary syndrome, bowel ischemia, DKA, pancreatitis, appendicitis, other acute abdomen or other emergent problem.  Patient is p.o. tolerant and is having regular bowel movements and therefore CT findings of possible SMA syndrome are extremely low.  Patient denies any abdominal pain including pain that is worsened after p.o. intake.  Reassessment: After treatment, the patient is feeling much better, tolerating PO fluids, and shows no signs of dehydration.   Disposition: Discharge home with prompt primary care physician follow up in the next 48 hours. Strict return precautions discussed.      ____________________________________________   FINAL CLINICAL IMPRESSION(S) / ED DIAGNOSES  Final diagnoses:  Non-intractable vomiting with nausea, unspecified vomiting type     ED  Discharge Orders          Ordered    ondansetron (ZOFRAN ODT) 4 MG disintegrating tablet  Every 8 hours PRN        03/13/21 2019             Note:  This document was prepared using Dragon voice recognition software and may include unintentional dictation errors.    Merwyn Katos, MD 03/13/21 [phone]

## 2021-03-13 NOTE — ED Notes (Signed)
Lab contacted to send phlebotomist for lavender collect

## 2021-03-13 NOTE — ED Triage Notes (Signed)
Pt to ED POV c/o N/V and lower abdominal pain. Pt states she has Yellow and brown emesis, and drank two nights ago.  Pt A&Ox4, NAD

## 2021-03-14 ENCOUNTER — Telehealth: Payer: Self-pay

## 2021-03-14 NOTE — Telephone Encounter (Signed)
Transition Care Management Unsuccessful Follow-up Telephone Call  Date of discharge and from where:  03/13/2021-[facility]  Attempts:  1st Attempt  Reason for unsuccessful TCM follow-up call:  Left voice message

## 2021-03-15 NOTE — Telephone Encounter (Signed)
Transition Care Management Unsuccessful Follow-up Telephone Call  Date of discharge and from where:  03/13/2021 from [facility]  Attempts:  2nd Attempt  Reason for unsuccessful TCM follow-up call:  Left voice message

## 2021-03-16 NOTE — Telephone Encounter (Signed)
Transition Care Management Unsuccessful Follow-up Telephone Call  Date of discharge and from where:  03/13/2021-[facility]  Attempts:  3rd Attempt  Reason for unsuccessful TCM follow-up call:  Left voice message

## 2021-03-24 ENCOUNTER — Ambulatory Visit: Payer: Medicaid Other | Admitting: Family Medicine

## 2021-03-29 ENCOUNTER — Other Ambulatory Visit: Payer: Self-pay

## 2021-03-29 ENCOUNTER — Ambulatory Visit: Payer: Medicaid Other

## 2021-03-29 ENCOUNTER — Other Ambulatory Visit (HOSPITAL_COMMUNITY)
Admission: RE | Admit: 2021-03-29 | Discharge: 2021-03-29 | Disposition: A | Discharge status: Home / Self Care | Payer: Medicaid Other | Source: Ambulatory Visit | Attending: Family Medicine | Admitting: Family Medicine

## 2021-03-29 VITALS — BP 127/81 | HR 90

## 2021-03-29 DIAGNOSIS — N898 Other specified noninflammatory disorders of vagina: Secondary | ICD-10-CM | POA: Insufficient documentation

## 2021-03-29 NOTE — Progress Notes (Signed)
Patient states she has had discharge for several days. Patient and I talked about how she has been in office every month this year with discharge concerns. Patient states she has used boric acid suppositories in the past but she states when she stops using them it comes back worse. Patient thinks its her IUD causing the BV infections.  Patient has annual exam scheduled and will discuss concerns at that appointment. Armandina Stammer RN

## 2021-03-30 LAB — CERVICOVAGINAL ANCILLARY ONLY
Bacterial Vaginitis (gardnerella): POSITIVE — AB
Candida Glabrata: NEGATIVE
Candida Vaginitis: NEGATIVE
Chlamydia: NEGATIVE
Comment: NEGATIVE
Comment: NEGATIVE
Comment: NEGATIVE
Comment: NEGATIVE
Comment: NEGATIVE
Comment: NORMAL
Neisseria Gonorrhea: NEGATIVE
Trichomonas: NEGATIVE

## 2021-03-30 NOTE — Progress Notes (Signed)
Chart reviewed - agree with CMA/RN documentation.  ° °

## 2021-03-31 ENCOUNTER — Other Ambulatory Visit: Payer: Self-pay | Admitting: Family Medicine

## 2021-03-31 MED ORDER — METRONIDAZOLE 0.75 % VA GEL: 1.0000 | Freq: Every day | VAGINAL | 1 refills | Status: DC

## 2021-04-01 IMAGING — US US MFM FETAL BPP W/O NON-STRESS
1 series · 12 of 16 positions shown · non-contrast
Comparison: none

[Series 1: us mfm fetal bpp w/o non-stress · 16 acquisitions, 12 frames shown]
[im 1/16]
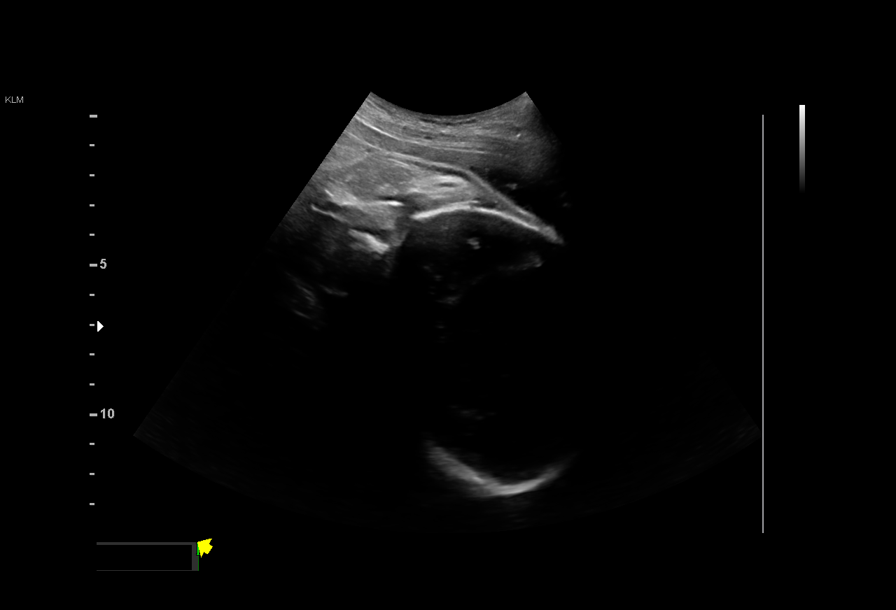
[im 3/16]
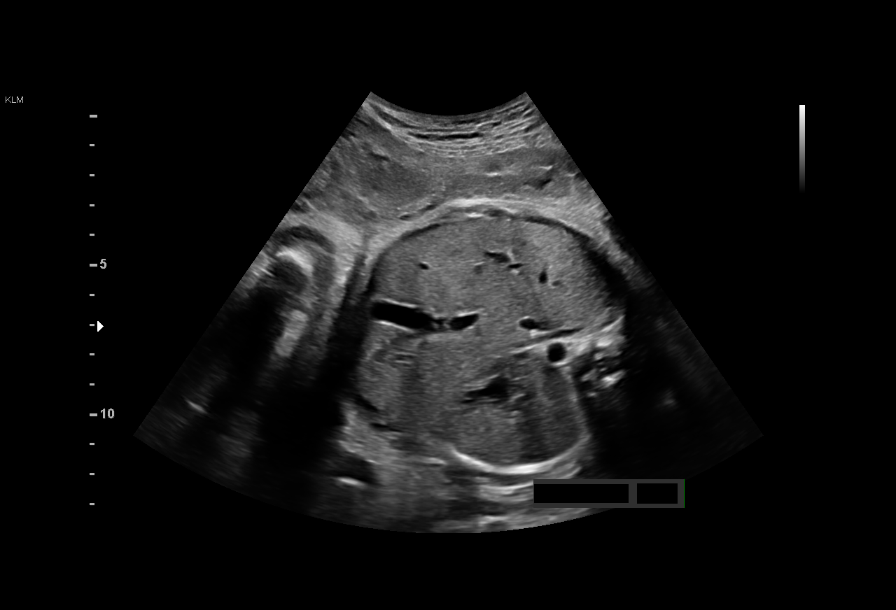
[im 4/16]
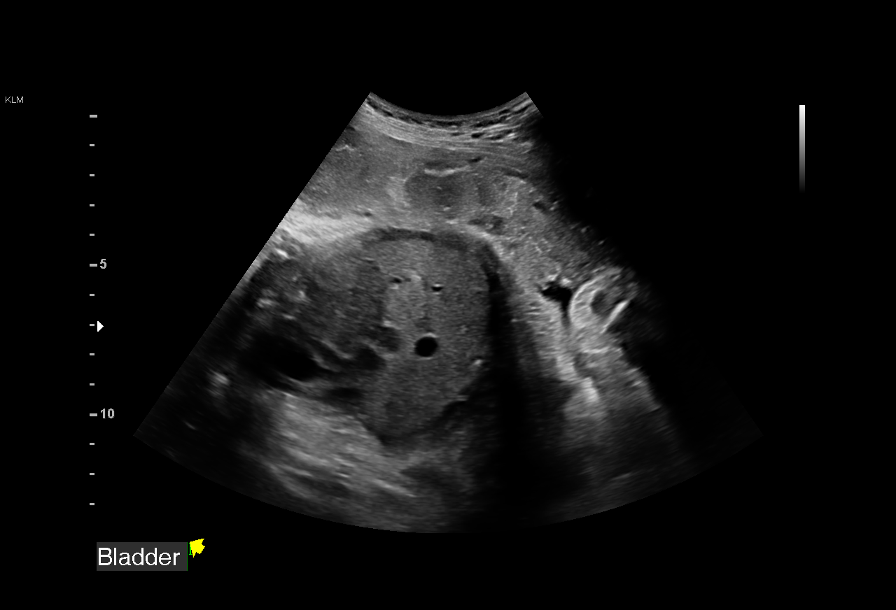
[im 5/16]
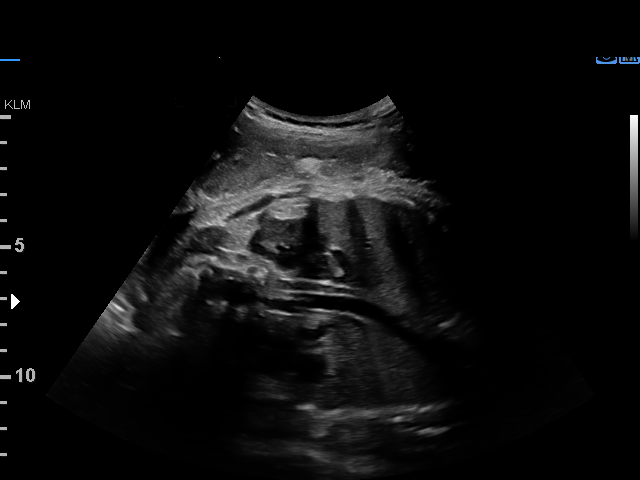
[im 7/16]
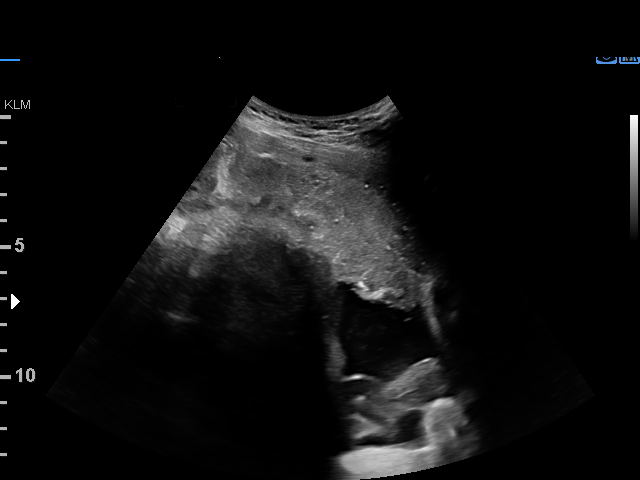
[im 8/16]
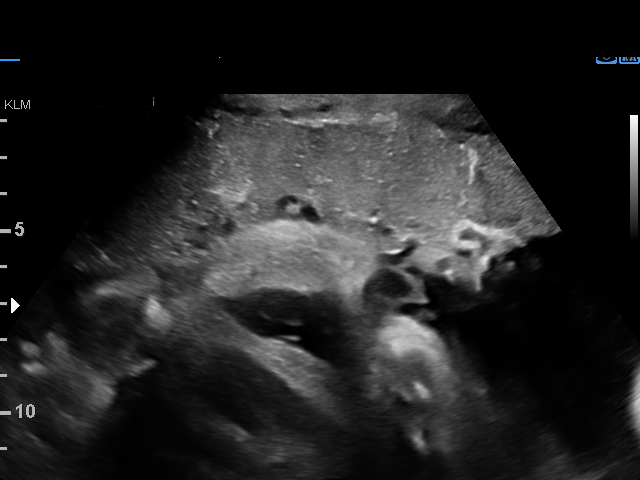
[im 9/16]
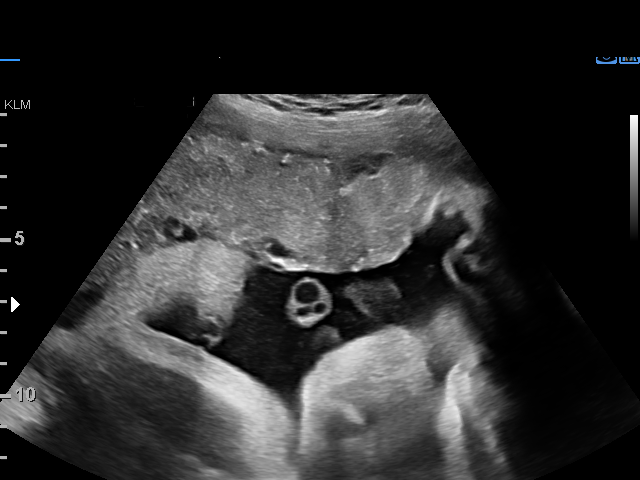
[im 11/16]
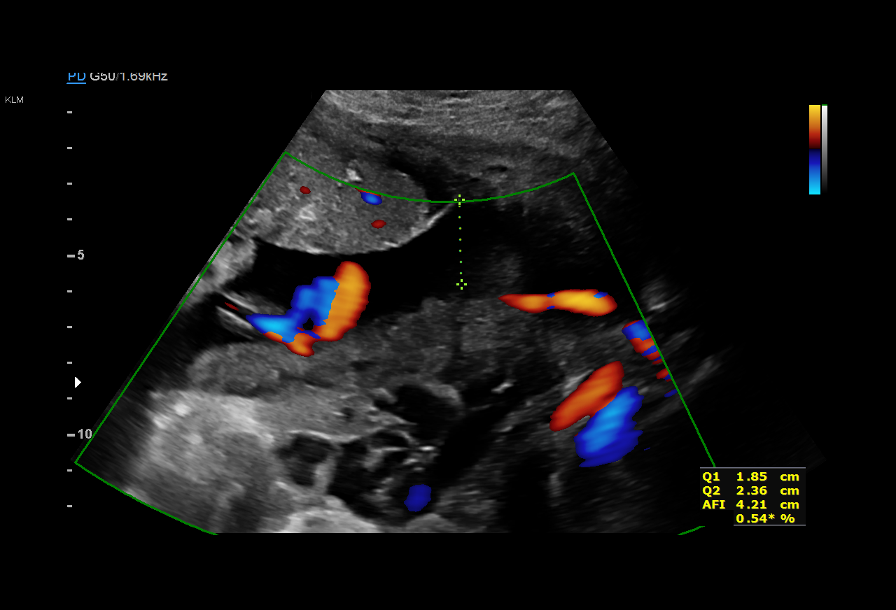
[im 12/16]
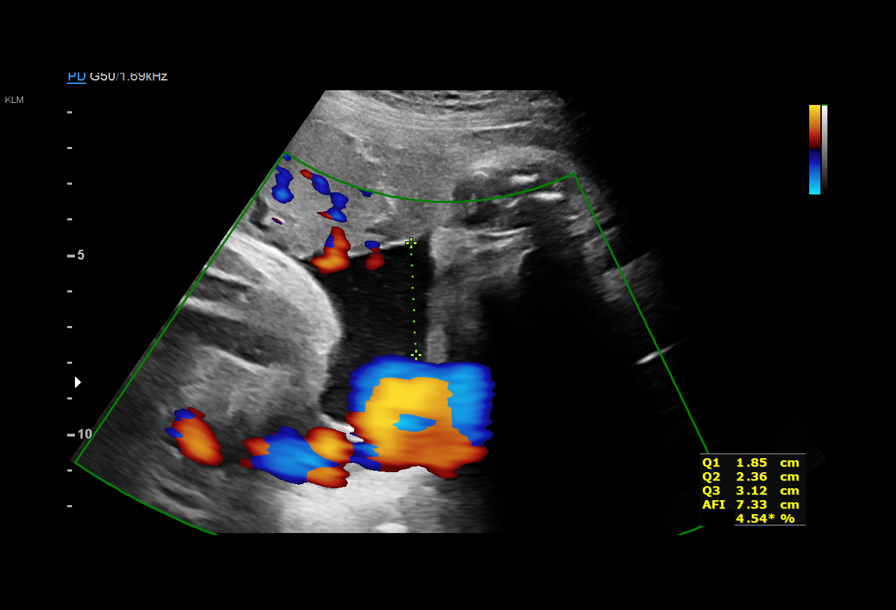
[im 13/16]
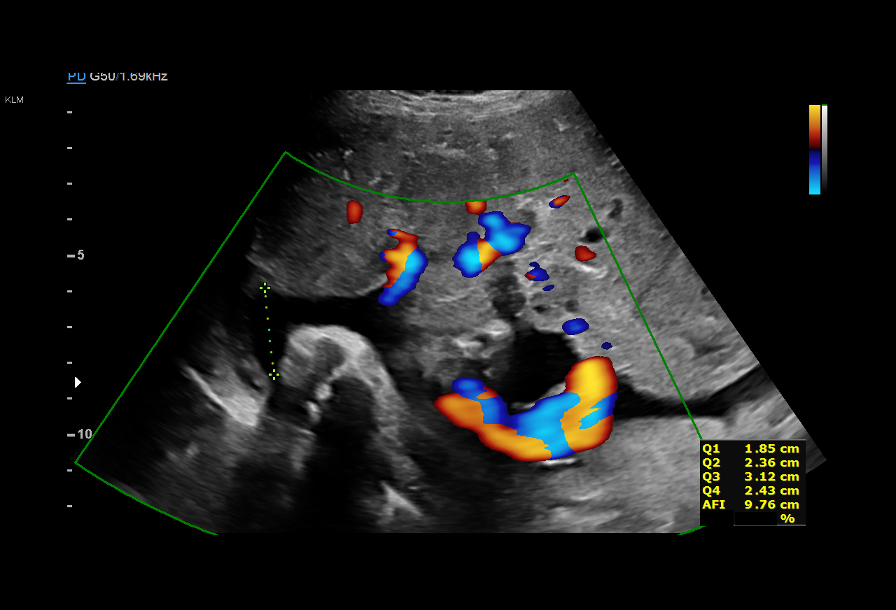
[im 15/16]
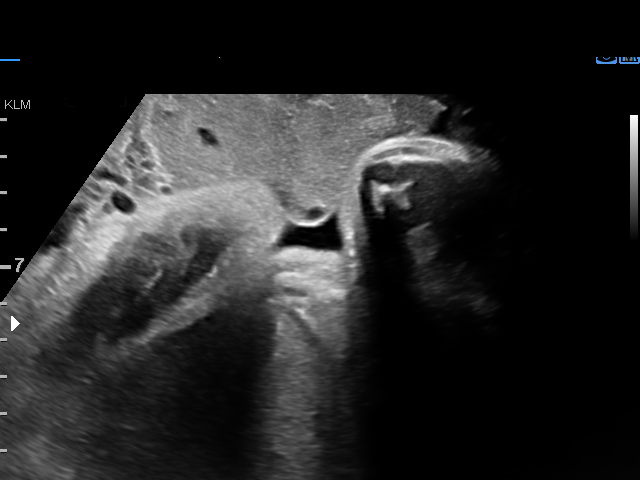
[im 16/16]
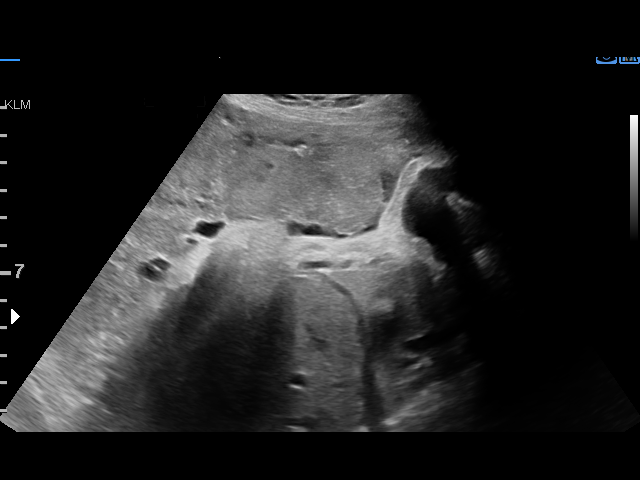

[12 of 16 positions shown; findings below may reference images not displayed]

MAU/Triage
 Referred By:      JUAN DANIEL SILAS              [facility]                              [HOSPITAL]

 ----------------------------------------------------------------------

 ----------------------------------------------------------------------
Indications

  Decreased fetal movements, third trimester,
  unspecified
  37 weeks gestation of pregnancy
 ----------------------------------------------------------------------
Fetal Evaluation

 Num Of Fetuses:         1
 Fetal Heart Rate(bpm):  135
 Cardiac Activity:       Observed
 Presentation:           Cephalic
 Placenta:               Anterior

 Amniotic Fluid
 AFI FV:      Within normal limits

 AFI Sum(cm)     %Tile       Largest Pocket(cm)
 9.76            22

 RUQ(cm)       RLQ(cm)       LUQ(cm)        LLQ(cm)

Biophysical Evaluation

 Amniotic F.V:   Within normal limits       F. Tone:        Observed
 F. Movement:    Observed                   Score:          [DATE]
 F. Breathing:   Observed
OB History
 Gravidity:    2         Term:   1        Prem:   0        SAB:   0
 Ectopic:      0        Living:  1
Gestational Age

 LMP:           37w 6d        Date:  08/28/18                 EDD:   06/04/19
 Best:          37w 0d     Det. By:  U/S  (03/05/19)          EDD:   06/10/19
Anatomy

 Stomach:               Appears normal, left   Bladder:                Appears normal
                        sided
Comments

 A biophysical profile performed today due to decreased fetal
 movements was [DATE].
 There was normal amniotic fluid noted on today's ultrasound
 exam.

## 2021-04-13 ENCOUNTER — Other Ambulatory Visit (HOSPITAL_BASED_OUTPATIENT_CLINIC_OR_DEPARTMENT_OTHER): Payer: Self-pay

## 2021-04-13 ENCOUNTER — Other Ambulatory Visit: Payer: Self-pay

## 2021-04-13 ENCOUNTER — Ambulatory Visit (INDEPENDENT_AMBULATORY_CARE_PROVIDER_SITE_OTHER): Payer: Medicaid Other | Admitting: Family Medicine

## 2021-04-13 ENCOUNTER — Other Ambulatory Visit (HOSPITAL_COMMUNITY)
Admission: RE | Admit: 2021-04-13 | Discharge: 2021-04-13 | Disposition: A | Discharge status: Home / Self Care | Payer: Medicaid Other | Source: Ambulatory Visit | Attending: Family Medicine | Admitting: Family Medicine

## 2021-04-13 ENCOUNTER — Encounter: Payer: Self-pay | Admitting: Family Medicine

## 2021-04-13 VITALS — BP 104/68 | HR 79 | Ht 66.0 in | Wt 141.0 lb

## 2021-04-13 DIAGNOSIS — B9689 Other specified bacterial agents as the cause of diseases classified elsewhere: Secondary | ICD-10-CM

## 2021-04-13 DIAGNOSIS — Z113 Encounter for screening for infections with a predominantly sexual mode of transmission: Secondary | ICD-10-CM | POA: Diagnosis not present

## 2021-04-13 DIAGNOSIS — Z01419 Encounter for gynecological examination (general) (routine) without abnormal findings: Secondary | ICD-10-CM | POA: Insufficient documentation

## 2021-04-13 DIAGNOSIS — N760 Acute vaginitis: Secondary | ICD-10-CM | POA: Insufficient documentation

## 2021-04-13 MED ORDER — METRONIDAZOLE 500 MG PO TABS
500.0000 mg | ORAL_TABLET | Freq: Two times a day (BID) | ORAL | 0 refills | Status: AC
Start: 2021-04-13 — End: 2021-04-20
  Filled 2021-04-13: qty 14, 7d supply, fill #0

## 2021-04-13 MED ORDER — METRONIDAZOLE 0.75 % VA GEL: 1.0000 | VAGINAL | 5 refills | Status: DC

## 2021-04-13 MED ORDER — METRONIDAZOLE 0.75 % VA GEL
1.0000 | VAGINAL | 5 refills | Status: DC
  Filled 2021-04-13: qty 70, 30d supply, fill #0

## 2021-04-13 MED ORDER — METRONIDAZOLE 0.75 % VA GEL: 1.0000 | Freq: Every day | VAGINAL | 0 refills | Status: DC

## 2021-04-13 MED ORDER — METRONIDAZOLE 500 MG PO TABS: 500.0000 mg | ORAL_TABLET | Freq: Two times a day (BID) | ORAL | 0 refills | Status: DC

## 2021-04-13 NOTE — Progress Notes (Signed)
GYNECOLOGY OFFICE VISIT NOTE  History:   Lauren Hanna is a 25 y.o. [phone] here today for annual exam.  Concerned about recurrent BV Wondering if something is wrong with her Has tried multiple rounds of flagyl Has also done boric acid for a week but still returned No douching Has been trying to change her diet to see if that would help   Health Maintenance Due  Topic Date Due   COVID-19 Vaccine (1) Never done   HPV VACCINES (1 - 2-dose series) Never done   Hepatitis C Screening  Never done   INFLUENZA VACCINE  02/21/2021    Past Medical History:  Diagnosis Date   Medical history non-contributory     History reviewed. No pertinent surgical history.  The following portions of the patient's history were reviewed and updated as appropriate: allergies, current medications, past family history, past medical history, past social history, past surgical history and problem list.   Health Maintenance:   Last pap: Lab Results  Component Value Date   DIAGPAP  03/18/2020    - Negative for intraepithelial lesion or malignancy (NILM)    Last mammogram:  N/a    Review of Systems:  Pertinent items noted in HPI and remainder of comprehensive ROS otherwise negative.  Physical Exam:  BP 104/68   Pulse 79   Ht 5\' 6"  (1.676 m)   Wt 141 lb (64 kg)   LMP 03/07/2021   BMI 22.76 kg/m  CONSTITUTIONAL: Well-developed, well-nourished female in no acute distress.  HEENT:  Normocephalic, atraumatic. External right and left ear normal. No scleral icterus.  NECK: Normal range of motion, supple, no masses noted on observation SKIN: No rash noted. Not diaphoretic. No erythema. No pallor. MUSCULOSKELETAL: Normal range of motion. No edema noted. NEUROLOGIC: Alert and oriented to person, place, and time. Normal muscle tone coordination.  PSYCHIATRIC: Normal mood and affect. Normal behavior. Normal judgment and thought content. RESPIRATORY: Effort normal, no problems with respiration  noted PELVIC: Normal appearing external genitalia; normal appearing vaginal mucosa and cervix. IUD strings present and approximately 3-4 cm long.  No abnormal discharge noted.  Labs and Imaging No results found for this or any previous visit (from the past 168 hour(s)). No results found.    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 Discussed this is a common problem and often attributed to biofilm so a longer course of therapy may be needed. 7 day course of PO flagyl today, asked patient to commit to twice weekly Metrogel suppressive therapy for the next 4-6 months, she is in agreement with this plan.       Relevant Medications   metroNIDAZOLE (FLAGYL) 500 MG tablet   metroNIDAZOLE (METROGEL VAGINAL) 0.75 % vaginal gel (Start on 04/14/2021)   Other Relevant Orders   Cervicovaginal ancillary only( [place])     Other   Well woman exam - Primary    Cancer screening: - Pap: NILM 02/2020 - Mammogram: n/a - Colonoscopy: n/a  Contraception: Mirena in place, happy with method  Mood: stable  Vaccinations: - Flu: reports she has already had - HPV: reports gardasil received when she was young - COVID: reports she is already vaccinated - PPSV23: not indicated - Other: n/a  Metabolic: - DM: not indicated - Lipids: not indicated  ID: - STI: offered, accepts  Substance Use: - denies any current  use of any substances      Other Visit Diagnoses     Routine screening for STI (sexually transmitted infection)       Relevant Orders   Cervicovaginal ancillary only( [place])   RPR+HBsAg+HCVAb+...       Routine preventative health maintenance measures emphasized. Please refer to After Visit Summary for other counseling recommendations.   Return in about 1 year (around 04/13/2022) for Annual Wellness Visit.    Total face-to-face time with patient: 20  minutes.  Over 50% of encounter was spent on counseling and coordination of care.   Venora Maples, MD/MPH Attending Family Medicine Physician, [facility] for [facility], [facility]

## 2021-04-13 NOTE — Assessment & Plan Note (Signed)
Cancer screening: - Pap: NILM 02/2020 - Mammogram: n/a - Colonoscopy: n/a  Contraception: Mirena in place, happy with method  Mood: stable  Vaccinations: - Flu: reports she has already had - HPV: reports gardasil received when she was young - COVID: reports she is already vaccinated - PPSV23: not indicated - Other: n/a  Metabolic: - DM: not indicated - Lipids: not indicated  ID: - STI: offered, accepts  Substance Use: - denies any current use of any substances

## 2021-04-13 NOTE — Assessment & Plan Note (Signed)
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 Discussed this is a common problem and often attributed to biofilm so a longer course of therapy may be needed. 7 day course of PO flagyl today, asked patient to commit to twice weekly Metrogel suppressive therapy for the next 4-6 months, she is in agreement with this plan.

## 2021-04-14 LAB — CERVICOVAGINAL ANCILLARY ONLY
Bacterial Vaginitis (gardnerella): POSITIVE — AB
Candida Glabrata: NEGATIVE
Candida Vaginitis: NEGATIVE
Chlamydia: NEGATIVE
Comment: NEGATIVE
Comment: NEGATIVE
Comment: NEGATIVE
Comment: NEGATIVE
Comment: NEGATIVE
Comment: NORMAL
Neisseria Gonorrhea: NEGATIVE
Trichomonas: NEGATIVE

## 2021-04-14 LAB — RPR+HBSAG+HCVAB+...
HIV Screen 4th Generation wRfx: NONREACTIVE
Hep C Virus Ab: <0.1 s/co ratio (ref 0.0–0.9)
Hepatitis B Surface Ag: NEGATIVE
RPR Ser Ql: NONREACTIVE

## 2021-04-19 DIAGNOSIS — N760 Acute vaginitis: Secondary | ICD-10-CM | POA: Diagnosis not present

## 2021-04-19 DIAGNOSIS — Z202 Contact with and (suspected) exposure to infections with a predominantly sexual mode of transmission: Secondary | ICD-10-CM | POA: Diagnosis not present

## 2021-04-19 DIAGNOSIS — B9689 Other specified bacterial agents as the cause of diseases classified elsewhere: Secondary | ICD-10-CM | POA: Diagnosis not present

## 2021-04-19 DIAGNOSIS — Z3202 Encounter for pregnancy test, result negative: Secondary | ICD-10-CM | POA: Diagnosis not present

## 2021-05-16 DIAGNOSIS — Z202 Contact with and (suspected) exposure to infections with a predominantly sexual mode of transmission: Secondary | ICD-10-CM | POA: Diagnosis not present

## 2021-05-16 DIAGNOSIS — N760 Acute vaginitis: Secondary | ICD-10-CM | POA: Diagnosis not present

## 2021-05-16 DIAGNOSIS — B9689 Other specified bacterial agents as the cause of diseases classified elsewhere: Secondary | ICD-10-CM | POA: Diagnosis not present

## 2021-06-24 DIAGNOSIS — Z113 Encounter for screening for infections with a predominantly sexual mode of transmission: Secondary | ICD-10-CM | POA: Diagnosis not present

## 2021-07-12 ENCOUNTER — Other Ambulatory Visit: Payer: Self-pay

## 2021-07-12 ENCOUNTER — Ambulatory Visit (INDEPENDENT_AMBULATORY_CARE_PROVIDER_SITE_OTHER): Payer: Medicaid Other

## 2021-07-12 ENCOUNTER — Other Ambulatory Visit (HOSPITAL_COMMUNITY)
Admission: RE | Admit: 2021-07-12 | Discharge: 2021-07-12 | Disposition: A | Discharge status: Home / Self Care | Payer: Medicaid Other | Source: Ambulatory Visit | Attending: Obstetrics & Gynecology | Admitting: Obstetrics & Gynecology

## 2021-07-12 VITALS — BP 123/76 | HR 84 | Wt 139.0 lb

## 2021-07-12 DIAGNOSIS — Z113 Encounter for screening for infections with a predominantly sexual mode of transmission: Secondary | ICD-10-CM | POA: Diagnosis not present

## 2021-07-12 DIAGNOSIS — Z7689 Persons encountering health services in other specified circumstances: Secondary | ICD-10-CM | POA: Diagnosis not present

## 2021-07-12 NOTE — Progress Notes (Addendum)
Pt presents for STD testing.Pt states that her ex told her that he tested positive for an STD. Self swab was sent to the lab. Marin Wisner l Mikayela Deats, CMA    Patient was assessed and managed by nursing staff during this encounter. I have reviewed the chart and agree with the documentation and plan. I have also made any necessary editorial changes.  Wynelle Bourgeois, CNM 07/12/2021 12:28 PM

## 2021-07-13 ENCOUNTER — Other Ambulatory Visit: Payer: Self-pay | Admitting: Advanced Practice Midwife

## 2021-07-13 DIAGNOSIS — B9689 Other specified bacterial agents as the cause of diseases classified elsewhere: Secondary | ICD-10-CM

## 2021-07-13 DIAGNOSIS — B3731 Acute candidiasis of vulva and vagina: Secondary | ICD-10-CM

## 2021-07-13 LAB — CERVICOVAGINAL ANCILLARY ONLY
Bacterial Vaginitis (gardnerella): POSITIVE — AB
Candida Glabrata: NEGATIVE
Candida Vaginitis: POSITIVE — AB
Chlamydia: NEGATIVE
Comment: NEGATIVE
Comment: NEGATIVE
Comment: NEGATIVE
Comment: NEGATIVE
Comment: NEGATIVE
Comment: NORMAL
Neisseria Gonorrhea: NEGATIVE
Trichomonas: NEGATIVE

## 2021-07-13 MED ORDER — FLUCONAZOLE 150 MG PO TABS: 150.0000 mg | ORAL_TABLET | Freq: Once | ORAL | 0 refills | Status: AC

## 2021-07-13 MED ORDER — METRONIDAZOLE 500 MG PO TABS: 500.0000 mg | ORAL_TABLET | Freq: Two times a day (BID) | ORAL | 0 refills | Status: DC

## 2021-07-13 NOTE — Progress Notes (Signed)
Cytology positive for yeast and BV Rx sent for both

## 2021-07-14 ENCOUNTER — Other Ambulatory Visit: Payer: Self-pay

## 2021-07-14 ENCOUNTER — Telehealth: Payer: Self-pay

## 2021-07-14 DIAGNOSIS — N760 Acute vaginitis: Secondary | ICD-10-CM

## 2021-07-14 DIAGNOSIS — B3731 Acute candidiasis of vulva and vagina: Secondary | ICD-10-CM

## 2021-07-14 MED ORDER — FLUCONAZOLE 150 MG PO TABS: 150.0000 mg | ORAL_TABLET | Freq: Once | ORAL | 3 refills | Status: AC

## 2021-07-14 MED ORDER — METRONIDAZOLE 0.75 % VA GEL
1.0000 | VAGINAL | 5 refills | Status: DC
Start: 2021-07-14 — End: 2021-09-13

## 2021-07-14 NOTE — Telephone Encounter (Signed)
Pt called and states she can not take Flagyl and is requesting Metrogel.  Rx for Metrogel sent Pt requesting Diflucan for yeast. Rx for Diflucan sent as well.

## 2021-09-08 ENCOUNTER — Encounter: Payer: Self-pay | Admitting: Family Medicine

## 2021-09-08 ENCOUNTER — Ambulatory Visit (INDEPENDENT_AMBULATORY_CARE_PROVIDER_SITE_OTHER): Payer: Medicaid Other | Admitting: Family Medicine

## 2021-09-08 ENCOUNTER — Other Ambulatory Visit: Payer: Self-pay

## 2021-09-08 VITALS — BP 105/72 | HR 97 | Wt 127.0 lb

## 2021-09-08 DIAGNOSIS — N898 Other specified noninflammatory disorders of vagina: Secondary | ICD-10-CM | POA: Diagnosis not present

## 2021-09-08 NOTE — Addendum Note (Signed)
Addended by: Truett Mainland on: 09/08/2021 02:09 PM   Modules accepted: Level of Service

## 2021-09-08 NOTE — Progress Notes (Signed)
Pt states she is having vaginal discharge with odor.

## 2021-09-08 NOTE — Progress Notes (Signed)
° °  Subjective:    Patient ID: Lauren Hanna, female    DOB: [birth year], 26 y.o.   MRN: [id number]  HPI Patient seen for recurrent BV.  Patient has positive tests in September x2 and in December.  She has been using boric acid twice a week.  She had been using MetroGel twice a week, but stopped that when she started using the boric acid.  She feels like as soon as she gets done treatment, she is good for about a week or so, then her symptoms come back.  Symptoms are foul-smelling discharge with vaginal odor.  She is sexually active with a female partner.  Her partner is using condoms with every sexual encounter.  She does have an IUD, but is reluctant to have it removed due to the convenience of it for birth control.   Review of Systems     Objective:   Physical Exam Vitals and nursing note reviewed. Exam conducted with a chaperone present.  Constitutional:      Appearance: Normal appearance.  Genitourinary:    Labia:        Right: No rash or tenderness.        Left: No rash or tenderness.      Vagina: Vaginal discharge (thin white) present.     Cervix: No cervical motion tenderness or friability.     Neurological:     Mental Status: She is alert.      Assessment & Plan:  1. Vaginal discharge We will have patient increase boric acid to 3 times a week and add MetroGel twice a week.  Patient can use both MetroGel and boric acid the same days or can do it on different days.  We will check new swab - NuSwab BV and Candida, NAA

## 2021-09-11 LAB — NUSWAB BV AND CANDIDA, NAA
Atopobium vaginae: HIGH Score — AB
BVAB 2: HIGH Score — AB
Candida albicans, NAA: NEGATIVE
Candida glabrata, NAA: NEGATIVE
Megasphaera 1: HIGH Score — AB

## 2021-09-13 ENCOUNTER — Other Ambulatory Visit: Payer: Self-pay

## 2021-09-13 DIAGNOSIS — B9689 Other specified bacterial agents as the cause of diseases classified elsewhere: Secondary | ICD-10-CM

## 2021-09-13 DIAGNOSIS — N760 Acute vaginitis: Secondary | ICD-10-CM

## 2021-09-13 MED ORDER — METRONIDAZOLE 0.75 % VA GEL: 1.0000 | VAGINAL | 5 refills | Status: DC

## 2021-10-12 DIAGNOSIS — Z113 Encounter for screening for infections with a predominantly sexual mode of transmission: Secondary | ICD-10-CM | POA: Diagnosis not present

## 2021-11-10 ENCOUNTER — Ambulatory Visit: Payer: Medicaid Other | Admitting: Family Medicine

## 2021-11-10 ENCOUNTER — Other Ambulatory Visit: Payer: Self-pay

## 2021-11-10 DIAGNOSIS — N760 Acute vaginitis: Secondary | ICD-10-CM

## 2021-11-10 MED ORDER — METRONIDAZOLE 0.75 % VA GEL: 1.0000 | VAGINAL | 5 refills | Status: DC

## 2021-12-01 ENCOUNTER — Ambulatory Visit: Payer: Medicaid Other | Admitting: Family Medicine

## 2021-12-12 DIAGNOSIS — Z113 Encounter for screening for infections with a predominantly sexual mode of transmission: Secondary | ICD-10-CM | POA: Diagnosis not present

## 2021-12-26 ENCOUNTER — Other Ambulatory Visit: Payer: Self-pay

## 2021-12-26 DIAGNOSIS — B9689 Other specified bacterial agents as the cause of diseases classified elsewhere: Secondary | ICD-10-CM

## 2021-12-26 MED ORDER — METRONIDAZOLE 0.75 % VA GEL: 1.0000 | VAGINAL | 5 refills | Status: AC

## 2022-01-17 ENCOUNTER — Other Ambulatory Visit: Payer: Self-pay

## 2022-01-17 ENCOUNTER — Emergency Department (HOSPITAL_COMMUNITY)
Admission: EM | Admit: 2022-01-17 | Discharge: 2022-01-17 | Disposition: A | Discharge status: Home / Self Care | Payer: Medicaid Other | Attending: Emergency Medicine | Admitting: Emergency Medicine

## 2022-01-17 ENCOUNTER — Encounter (HOSPITAL_COMMUNITY): Payer: Self-pay

## 2022-01-17 DIAGNOSIS — R112 Nausea with vomiting, unspecified: Secondary | ICD-10-CM | POA: Diagnosis not present

## 2022-01-17 DIAGNOSIS — E876 Hypokalemia: Secondary | ICD-10-CM | POA: Insufficient documentation

## 2022-01-17 DIAGNOSIS — R197 Diarrhea, unspecified: Secondary | ICD-10-CM | POA: Diagnosis not present

## 2022-01-17 DIAGNOSIS — R110 Nausea: Secondary | ICD-10-CM | POA: Diagnosis not present

## 2022-01-17 DIAGNOSIS — R109 Unspecified abdominal pain: Secondary | ICD-10-CM | POA: Diagnosis not present

## 2022-01-17 DIAGNOSIS — R1110 Vomiting, unspecified: Secondary | ICD-10-CM | POA: Diagnosis present

## 2022-01-17 DIAGNOSIS — E860 Dehydration: Secondary | ICD-10-CM | POA: Diagnosis not present

## 2022-01-17 DIAGNOSIS — R1111 Vomiting without nausea: Secondary | ICD-10-CM | POA: Diagnosis not present

## 2022-01-17 LAB — CBC
HCT: 35.1 % — ABNORMAL LOW (ref 36.0–46.0)
Hemoglobin: 12.5 g/dL (ref 12.0–15.0)
MCH: 32.2 pg (ref 26.0–34.0)
MCHC: 35.6 g/dL (ref 30.0–36.0)
MCV: 90.5 fL (ref 80.0–100.0)
Platelets: 221 10*3/uL (ref 150–400)
RBC: 3.88 MIL/uL (ref 3.87–5.11)
RDW: 11.8 % (ref 11.5–15.5)
WBC: 7.1 10*3/uL (ref 4.0–10.5)
nRBC: 0 % (ref 0.0–0.2)

## 2022-01-17 LAB — COMPREHENSIVE METABOLIC PANEL
ALT: 16 U/L (ref 0–44)
AST: 18 U/L (ref 15–41)
Albumin: 4.3 g/dL (ref 3.5–5.0)
Alkaline Phosphatase: 38 U/L (ref 38–126)
Anion gap: 6 (ref 5–15)
BUN: 17 mg/dL (ref 6–20)
CO2: 27 mmol/L (ref 22–32)
Calcium: 9.2 mg/dL (ref 8.9–10.3)
Chloride: 106 mmol/L (ref 98–111)
Creatinine, Ser: 0.75 mg/dL (ref 0.44–1.00)
GFR, Estimated: >60 mL/min (ref >60)
Glucose, Bld: 97 mg/dL (ref 70–99)
Potassium: 2.7 mmol/L — CL (ref 3.5–5.1)
Sodium: 139 mmol/L (ref 135–145)
Total Bilirubin: 1.3 mg/dL — ABNORMAL HIGH (ref 0.3–1.2)
Total Protein: 7.3 g/dL (ref 6.5–8.1)

## 2022-01-17 LAB — URINALYSIS, ROUTINE W REFLEX MICROSCOPIC
Bacteria, UA: NONE SEEN
Bilirubin Urine: NEGATIVE
Glucose, UA: NEGATIVE mg/dL
Hgb urine dipstick: NEGATIVE
Ketones, ur: 5 mg/dL — AB
Nitrite: NEGATIVE
Protein, ur: 30 mg/dL — AB
Specific Gravity, Urine: 1.035 — ABNORMAL HIGH (ref 1.005–1.030)
pH: 6 (ref 5.0–8.0)

## 2022-01-17 LAB — I-STAT BETA HCG BLOOD, ED (MC, WL, AP ONLY): I-stat hCG, quantitative: <5 m[IU]/mL

## 2022-01-17 LAB — CBG MONITORING, ED: Glucose-Capillary: 98 mg/dL (ref 70–99)

## 2022-01-17 LAB — MAGNESIUM: Magnesium: 2.2 mg/dL (ref 1.7–2.4)

## 2022-01-17 LAB — LIPASE, BLOOD: Lipase: 54 U/L — ABNORMAL HIGH (ref 11–51)

## 2022-01-17 MED ORDER — POTASSIUM CHLORIDE ER 10 MEQ PO TBCR: 10.0000 meq | EXTENDED_RELEASE_TABLET | Freq: Two times a day (BID) | ORAL | 0 refills | Status: AC

## 2022-01-17 MED ORDER — POTASSIUM CHLORIDE 10 MEQ/100ML IV SOLN
10.0000 meq | Freq: Once | INTRAVENOUS | Status: AC
  Administered 2022-01-17: 10 meq via INTRAVENOUS
  Filled 2022-01-17: qty 100

## 2022-01-17 MED ORDER — LACTATED RINGERS IV BOLUS
1000.0000 mL | Freq: Once | INTRAVENOUS | Status: AC
  Administered 2022-01-17: 1000 mL via INTRAVENOUS

## 2022-01-17 MED ORDER — ONDANSETRON HCL 4 MG/2ML IJ SOLN
4.0000 mg | Freq: Once | INTRAMUSCULAR | Status: AC
  Administered 2022-01-17: 4 mg via INTRAVENOUS
  Filled 2022-01-17: qty 2

## 2022-01-17 MED ORDER — ONDANSETRON 4 MG PO TBDP: 4.0000 mg | ORAL_TABLET | Freq: Three times a day (TID) | ORAL | 0 refills | Status: AC | PRN

## 2022-01-17 NOTE — ED Triage Notes (Signed)
Pt BIB EMS from work. Pt reports N/V/D x4 days with generalized weakness. Pt reports feeling lightheaded when driving to work. Pt passed out at work. Pt also endorses abdominal cramping and leg cramping.

## 2022-01-17 NOTE — ED Provider Notes (Signed)
[facility]PT Provider Note   CSN: [id number] Arrival date & time: 01/17/22  1056     History  Chief Complaint  Patient presents with   Loss of Consciousness   Emesis    Lauren Hanna is a 26 y.o. female.   Loss of Consciousness Associated symptoms: vomiting   Emesis Patient presents primarily nausea and vomiting.  Began around 4 days ago.  Decreased oral intake.  States everything she will come back up.  A little diarrhea today.  No fevers or chills.  No known sick contacts.  Mild abdominal cramping.  Denies pregnancy.  States she is feeling more lightheaded.  States eyes are little blurry.  Worse if she attempts to stand up.      Home Medications Prior to Admission medications   Medication Sig Start Date End Date Taking? Authorizing Provider  levonorgestrel (LILETTA, 52 MG,) 20.1 MCG/DAY IUD 1 each by Intrauterine route once.   Yes [provider]  ondansetron (ZOFRAN-ODT) 4 MG disintegrating tablet Take 1 tablet (4 mg total) by mouth every 8 (eight) hours as needed for nausea or vomiting. 01/17/22  Yes Benjiman Core, MD  potassium chloride (KLOR-CON) 10 MEQ tablet Take 1 tablet (10 mEq total) by mouth 2 (two) times daily. 01/17/22  Yes Benjiman Core, MD  metroNIDAZOLE (METROGEL VAGINAL) 0.75 % vaginal gel Place 1 Applicatorful vaginally 2 (two) times a week. Patient not taking: Reported on 01/17/2022 12/26/21   Levie Heritage, DO      Allergies    Patient has no known allergies.    Review of Systems   Review of Systems  Cardiovascular:  Positive for syncope.  Gastrointestinal:  Positive for vomiting.    Physical Exam Updated Vital Signs BP 105/88   Pulse 71   Temp 98.3 F (36.8 C) (Oral)   Resp 10   SpO2 98%  Physical Exam Vitals and nursing note reviewed.  HENT:     Head: Atraumatic.     Mouth/Throat:     Mouth: Mucous membranes are moist.  Cardiovascular:     Rate and Rhythm: Regular rhythm.   Pulmonary:     Breath sounds: No wheezing or rhonchi.  Abdominal:     General: There is no distension.  Musculoskeletal:        General: No tenderness.  Skin:    General: Skin is warm.     Capillary Refill: Capillary refill takes less than 2 seconds.  Neurological:     Mental Status: She is alert and oriented to person, place, and time.     ED Results / Procedures / Treatments   Labs (all labs ordered are listed, but only abnormal results are displayed) Labs Reviewed  LIPASE, BLOOD - Abnormal; Notable for the following components:      Result Value   Lipase 54 (*)    All other components within normal limits  COMPREHENSIVE METABOLIC PANEL - Abnormal; Notable for the following components:   Potassium 2.7 (*)    Total Bilirubin 1.3 (*)    All other components within normal limits  CBC - Abnormal; Notable for the following components:   HCT 35.1 (*)    All other components within normal limits  URINALYSIS, ROUTINE W REFLEX MICROSCOPIC - Abnormal; Notable for the following components:   Color, Urine AMBER (*)    APPearance HAZY (*)    Specific Gravity, Urine 1.035 (*)    Ketones, ur 5 (*)    Protein, ur 30 (*)  Leukocytes,Ua SMALL (*)    All other components within normal limits  MAGNESIUM  CBG MONITORING, ED  I-STAT BETA HCG BLOOD, ED ([facility], [facility], [facility] ONLY)    EKG EKG Interpretation  Date/Time:  Tuesday January 17 2022 11:05:30 EDT Ventricular Rate:  62 PR Interval:  190 QRS Duration: 82 QT Interval:  428 QTC Calculation: 435 R Axis:   65 Text Interpretation: Sinus rhythm Borderline T abnormalities, anterior leads No old tracing to compare Confirmed by Benjiman Core [phone]) on 01/17/2022 12:20:42 PM  Radiology No results found.  Procedures Procedures    Medications Ordered in ED Medications  lactated ringers bolus 1,000 mL (0 mLs Intravenous Stopped 01/17/22 1352)  potassium chloride 10 mEq in 100 mL IVPB (0 mEq Intravenous Stopped 01/17/22 1352)   ondansetron (ZOFRAN) injection 4 mg (4 mg Intravenous Given 01/17/22 1306)  lactated ringers bolus 1,000 mL (1,000 mLs Intravenous New Bag/Given 01/17/22 1547)    ED Course/ Medical Decision Making/ A&P                           Medical Decision Making Amount and/or Complexity of Data Reviewed Labs: ordered.  Risk Prescription drug management.   Patient presents with nausea vomiting and some diarrhea.  Began a few days ago.  Now feeling more lightheadedness and dizziness.  Differential gnosis includes dehydration, gastroenteritis, acute kidney failure, intra-abdominal pathology.  Lab work reassuring.  Does show likely dehydration.  Mild hypokalemia.  Potassium centimeters slightly low and lipase slightly above normal.  Both will need to be followed.  Feeling better and has tolerated orals.  Had been given 2 L of fluid.  Will supplement potassium at home and also add Zofran.  Appears stable for discharge.  Doubt severe intra-abdominal pathology at this time.        Final Clinical Impression(s) / ED Diagnoses Final diagnoses:  Hypokalemia  Dehydration  Nausea vomiting and diarrhea    Rx / DC Orders ED Discharge Orders          Ordered    potassium chloride (KLOR-CON) 10 MEQ tablet  2 times daily        01/17/22 1638    ondansetron (ZOFRAN-ODT) 4 MG disintegrating tablet  Every 8 hours PRN        01/17/22 1638              Benjiman Core, MD 01/17/22 1651

## 2022-01-18 ENCOUNTER — Telehealth: Payer: Self-pay

## 2022-01-18 NOTE — Telephone Encounter (Signed)
Transition Care Management Unsuccessful Follow-up Telephone Call  Date of discharge and from where:  01/17/2022 from [facility]  Attempts:  1st Attempt  Reason for unsuccessful TCM follow-up call:  Voice mail full

## 2022-01-19 NOTE — Telephone Encounter (Signed)
Transition Care Management Unsuccessful Follow-up Telephone Call  Date of discharge and from where:  01/17/2022 from [facility]  Attempts:  2nd Attempt  Reason for unsuccessful TCM follow-up call:  Voice mail full

## 2022-01-20 NOTE — Telephone Encounter (Signed)
Transition Care Management Unsuccessful Follow-up Telephone Call  Date of discharge and from where:  01/17/2022 from [id number]  Attempts:  3rd Attempt  Reason for unsuccessful TCM follow-up call:  Unable to reach patient

## 2022-02-09 ENCOUNTER — Encounter: Payer: Self-pay | Admitting: General Practice

## 2022-02-20 DIAGNOSIS — Z113 Encounter for screening for infections with a predominantly sexual mode of transmission: Secondary | ICD-10-CM | POA: Diagnosis not present

## 2022-06-13 DIAGNOSIS — N760 Acute vaginitis: Secondary | ICD-10-CM | POA: Diagnosis not present

## 2022-06-13 DIAGNOSIS — Z3202 Encounter for pregnancy test, result negative: Secondary | ICD-10-CM | POA: Diagnosis not present

## 2022-06-13 DIAGNOSIS — Z113 Encounter for screening for infections with a predominantly sexual mode of transmission: Secondary | ICD-10-CM | POA: Diagnosis not present

## 2022-07-11 DIAGNOSIS — N898 Other specified noninflammatory disorders of vagina: Secondary | ICD-10-CM | POA: Diagnosis not present

## 2023-01-24 IMAGING — CT CT ABD-PELV W/ CM
2 of 4 series · 16 of 46 positions shown, 18 images · IV contrast (APPLIED)
Comparison: None.

CLINICAL DATA: Right lower quadrant abdominal pain. Appendicitis
suspected.

EXAM:
CT ABDOMEN AND PELVIS WITH CONTRAST
TECHNIQUE: Multidetector CT imaging of the abdomen and pelvis was performed
using the standard protocol following bolus administration of
intravenous contrast.
CONTRAST:  80mL OMNIPAQUE IOHEXOL 350 MG/ML SOLN

[Series 2: routine abd/pel with · axial · 0.69mm/px · z∈[-996,-566]mm · 13 of 94 slices shown, 15 images]
[im 4/94  soft-tissue]
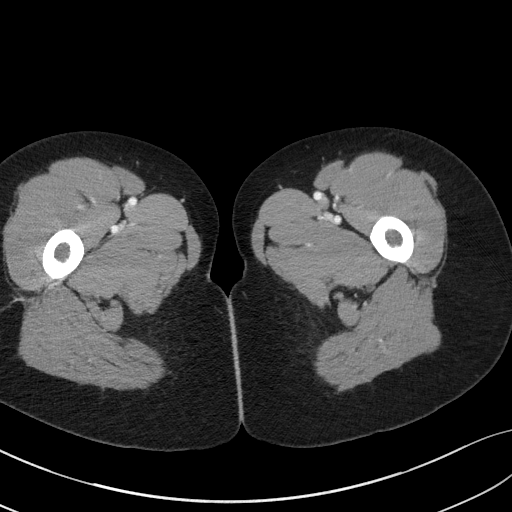
[im 4/94  bone]
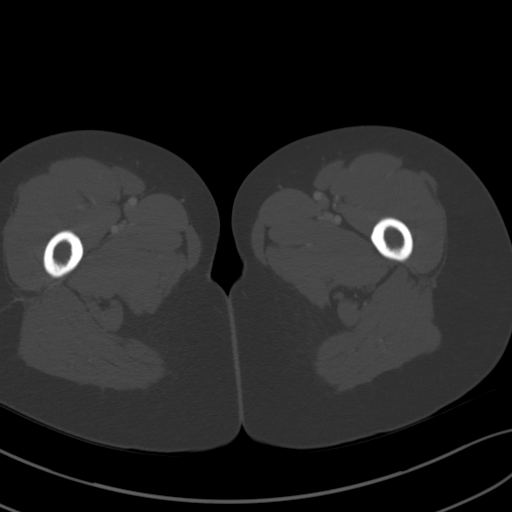
[im 12/94  soft-tissue]
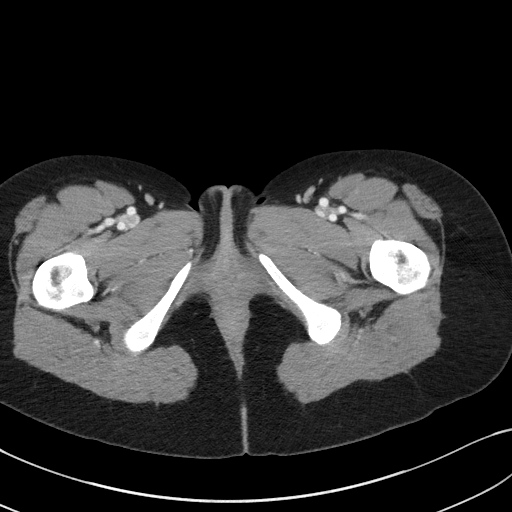
[im 20/94  soft-tissue]
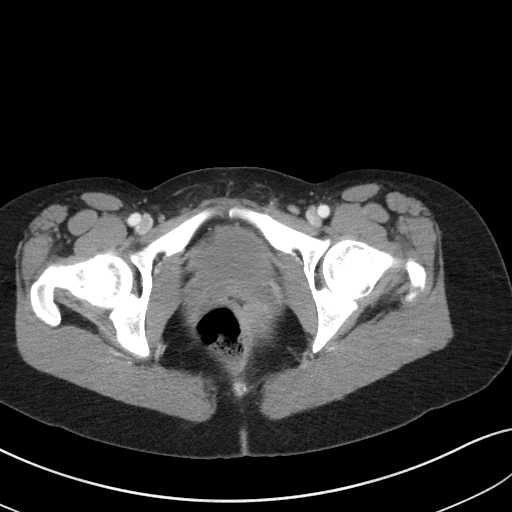
[im 28/94  soft-tissue]
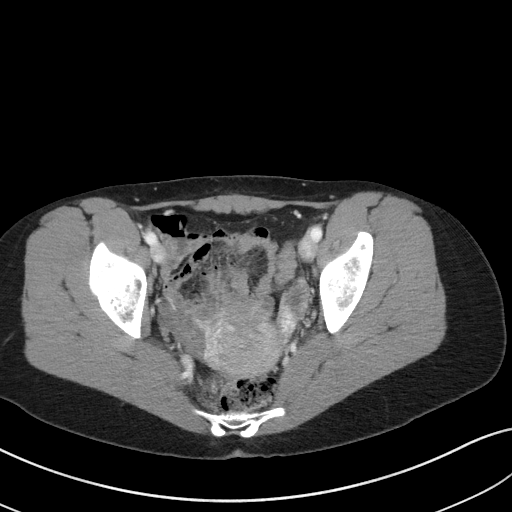
[im 32/94  soft-tissue]
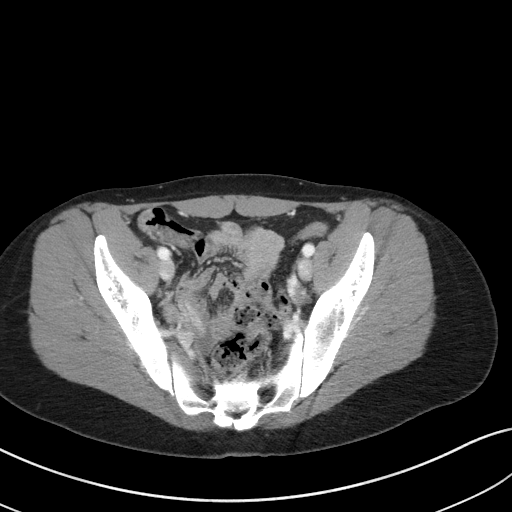
[im 39/94  soft-tissue]
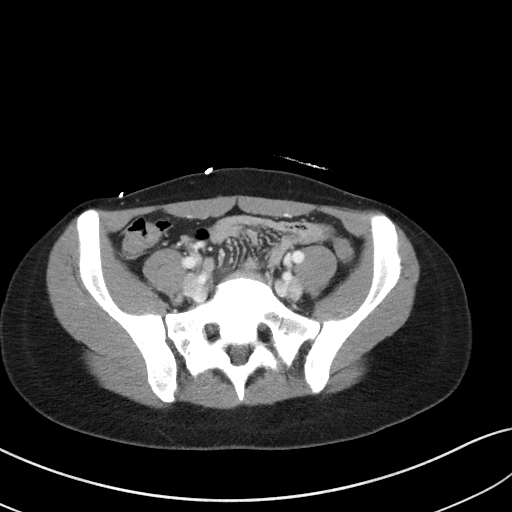
[im 47/94  soft-tissue]
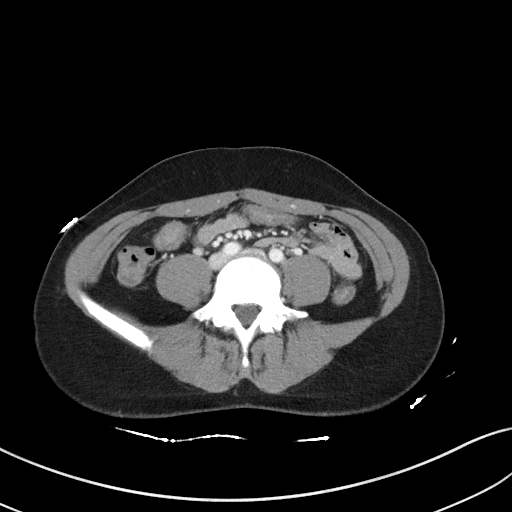
[im 55/94  soft-tissue]
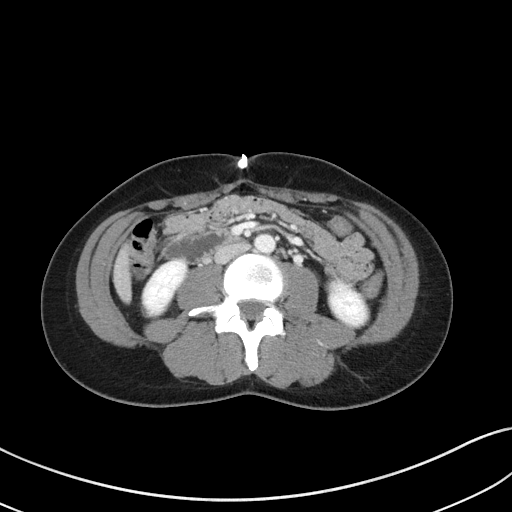
[im 63/94  soft-tissue]
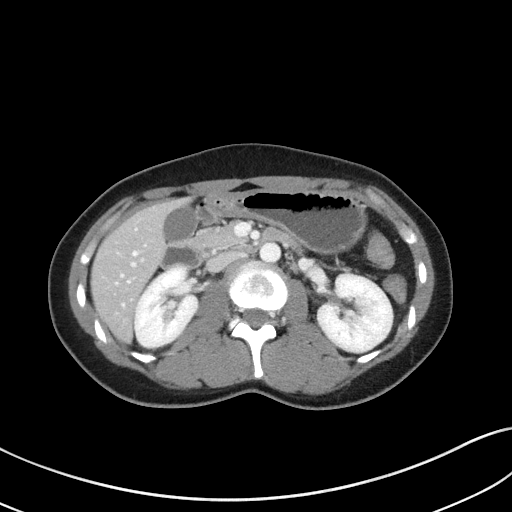
[im 63/94  bone]
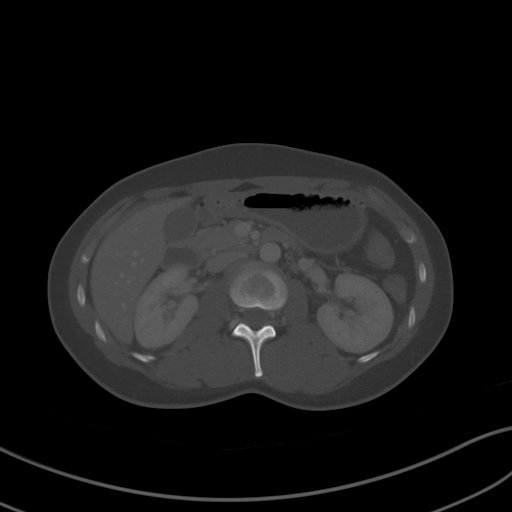
[im 66/94  soft-tissue]
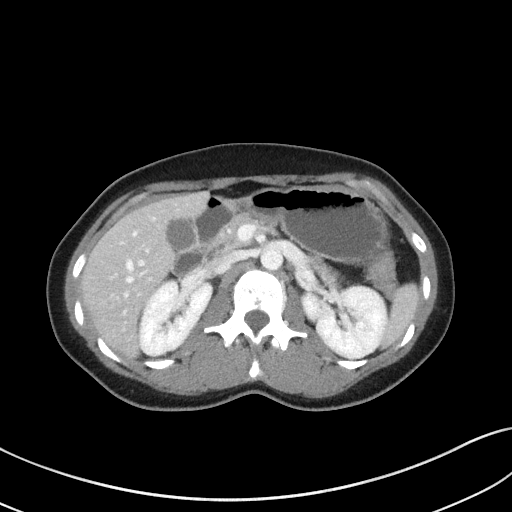
[im 74/94  soft-tissue]
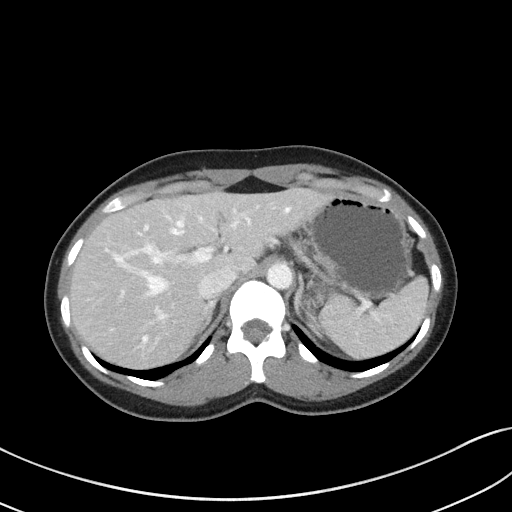
[im 82/94  soft-tissue]
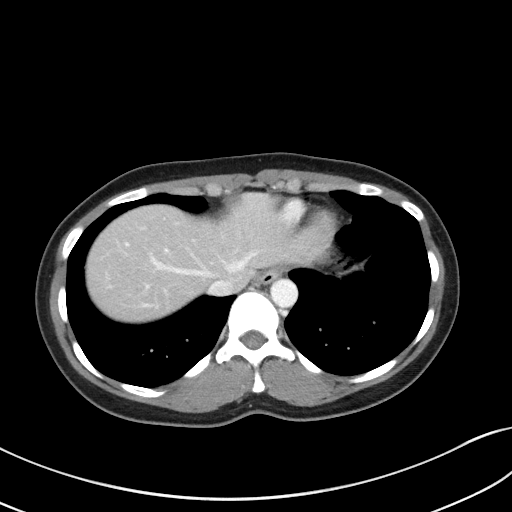
[im 90/94  soft-tissue]
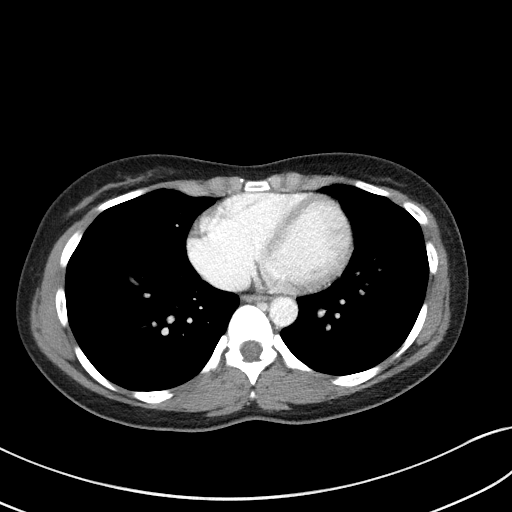

[Series 5: coronal st · coronal · 0.70mm/px · 3 of 80 slices shown]
[im 27/80  soft-tissue]
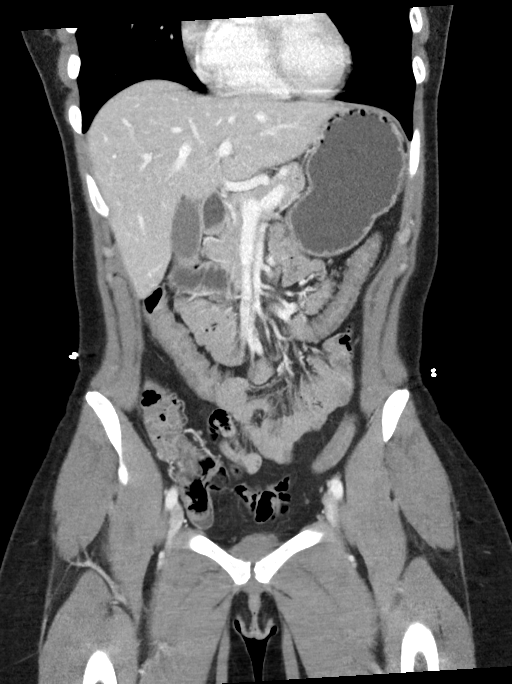
[im 36/80  soft-tissue]
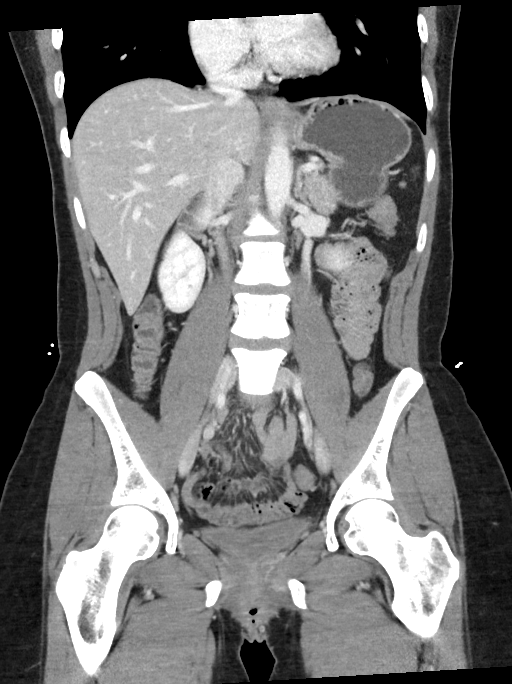
[im 44/80  soft-tissue]
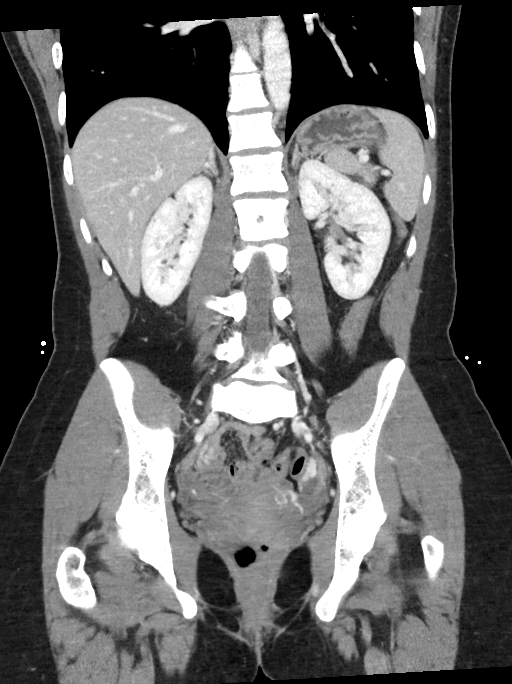

[16 of 46 positions shown; findings below may reference images not displayed]

FINDINGS: Lower chest: Lung bases are clear.

Hepatobiliary: No focal liver abnormality is seen. No gallstones,
gallbladder wall thickening, or biliary dilatation.

Pancreas: Unremarkable. No pancreatic ductal dilatation or
surrounding inflammatory changes.

Spleen: Normal in size without focal abnormality.

Adrenals/Urinary Tract: Adrenal glands are unremarkable. Kidneys are
normal, without renal calculi, focal lesion, or hydronephrosis.
Bladder is unremarkable.

Stomach/Bowel: Fluid-filled nondistended stomach. The proximal
duodenum is fluid-filled and mildly distended to the level of the
superior mesenteric artery. The distal duodenum is decompressed.
This may indicate SMA syndrome, where the duodenum is compressed by
the overlying superior mesenteric artery. The remainder of the small
bowel and colon are mostly decompressed. Scattered stool in the
colon. No wall thickening or inflammatory changes. The appendix is
normal.

Vascular/Lymphatic: No significant vascular findings are present. No
enlarged abdominal or pelvic lymph nodes.

Reproductive: Uterus is retroverted. An intrauterine device is
present centrally in the uterus. Ovaries are not enlarged.
Involuting cyst in the right ovary. Small amount of free fluid in
the pelvis is likely physiologic.

Other: No free air in the abdomen. Abdominal wall musculature
appears intact.

Musculoskeletal: No acute or significant osseous findings.
IMPRESSION: 1. Fluid-filled stomach and proximal duodenum with mild proximal
duodenal distension and distal duodenal decompression. This may
indicate SMA syndrome.
2. Normal appendix.
3. Intrauterine device present.

## 2023-07-26 DIAGNOSIS — N760 Acute vaginitis: Secondary | ICD-10-CM | POA: Diagnosis not present

## 2023-07-26 DIAGNOSIS — Z113 Encounter for screening for infections with a predominantly sexual mode of transmission: Secondary | ICD-10-CM | POA: Diagnosis not present

## 2023-12-10 DIAGNOSIS — H60502 Unspecified acute noninfective otitis externa, left ear: Secondary | ICD-10-CM | POA: Diagnosis not present

## 2024-01-18 DIAGNOSIS — S39012A Strain of muscle, fascia and tendon of lower back, initial encounter: Secondary | ICD-10-CM | POA: Diagnosis not present

## 2024-01-18 DIAGNOSIS — X58XXXA Exposure to other specified factors, initial encounter: Secondary | ICD-10-CM | POA: Diagnosis not present

## 2024-02-17 DIAGNOSIS — Z79899 Other long term (current) drug therapy: Secondary | ICD-10-CM | POA: Diagnosis not present

## 2024-02-17 DIAGNOSIS — R58 Hemorrhage, not elsewhere classified: Secondary | ICD-10-CM | POA: Diagnosis not present

## 2024-02-17 DIAGNOSIS — T07XXXA Unspecified multiple injuries, initial encounter: Secondary | ICD-10-CM | POA: Diagnosis not present

## 2024-02-17 DIAGNOSIS — S61216A Laceration without foreign body of right little finger without damage to nail, initial encounter: Secondary | ICD-10-CM | POA: Diagnosis not present

## 2024-02-17 DIAGNOSIS — S61217A Laceration without foreign body of left little finger without damage to nail, initial encounter: Secondary | ICD-10-CM | POA: Diagnosis not present

## 2024-02-20 DIAGNOSIS — S022XXD Fracture of nasal bones, subsequent encounter for fracture with routine healing: Secondary | ICD-10-CM | POA: Diagnosis not present

## 2024-02-20 DIAGNOSIS — S61401A Unspecified open wound of right hand, initial encounter: Secondary | ICD-10-CM | POA: Diagnosis not present

## 2024-02-22 DIAGNOSIS — S61216A Laceration without foreign body of right little finger without damage to nail, initial encounter: Secondary | ICD-10-CM | POA: Diagnosis not present

## 2024-02-22 DIAGNOSIS — S64496A Injury of digital nerve of right little finger, initial encounter: Secondary | ICD-10-CM | POA: Diagnosis not present

## 2024-02-22 DIAGNOSIS — S66126A Laceration of flexor muscle, fascia and tendon of right little finger at wrist and hand level, initial encounter: Secondary | ICD-10-CM | POA: Diagnosis not present

## 2024-02-22 DIAGNOSIS — S66196A Other injury of flexor muscle, fascia and tendon of right little finger at wrist and hand level, initial encounter: Secondary | ICD-10-CM | POA: Diagnosis not present

## 2024-03-03 DIAGNOSIS — R2231 Localized swelling, mass and lump, right upper limb: Secondary | ICD-10-CM | POA: Diagnosis not present

## 2024-03-03 DIAGNOSIS — M79644 Pain in right finger(s): Secondary | ICD-10-CM | POA: Diagnosis not present

## 2024-03-03 DIAGNOSIS — M79641 Pain in right hand: Secondary | ICD-10-CM | POA: Diagnosis not present

## 2024-03-03 DIAGNOSIS — S61411D Laceration without foreign body of right hand, subsequent encounter: Secondary | ICD-10-CM | POA: Diagnosis not present

## 2024-03-03 DIAGNOSIS — M25641 Stiffness of right hand, not elsewhere classified: Secondary | ICD-10-CM | POA: Diagnosis not present

## 2024-03-03 DIAGNOSIS — R29898 Other symptoms and signs involving the musculoskeletal system: Secondary | ICD-10-CM | POA: Diagnosis not present

## 2024-03-06 DIAGNOSIS — S61411D Laceration without foreign body of right hand, subsequent encounter: Secondary | ICD-10-CM | POA: Diagnosis not present

## 2024-03-06 DIAGNOSIS — R29898 Other symptoms and signs involving the musculoskeletal system: Secondary | ICD-10-CM | POA: Diagnosis not present

## 2024-03-06 DIAGNOSIS — M25641 Stiffness of right hand, not elsewhere classified: Secondary | ICD-10-CM | POA: Diagnosis not present

## 2024-03-06 DIAGNOSIS — R2231 Localized swelling, mass and lump, right upper limb: Secondary | ICD-10-CM | POA: Diagnosis not present

## 2024-03-06 DIAGNOSIS — M79641 Pain in right hand: Secondary | ICD-10-CM | POA: Diagnosis not present

## 2024-03-06 DIAGNOSIS — M79644 Pain in right finger(s): Secondary | ICD-10-CM | POA: Diagnosis not present

## 2024-03-13 DIAGNOSIS — S61411D Laceration without foreign body of right hand, subsequent encounter: Secondary | ICD-10-CM | POA: Diagnosis not present

## 2024-03-13 DIAGNOSIS — R29898 Other symptoms and signs involving the musculoskeletal system: Secondary | ICD-10-CM | POA: Diagnosis not present

## 2024-03-13 DIAGNOSIS — M25641 Stiffness of right hand, not elsewhere classified: Secondary | ICD-10-CM | POA: Diagnosis not present

## 2024-03-13 DIAGNOSIS — M79641 Pain in right hand: Secondary | ICD-10-CM | POA: Diagnosis not present

## 2024-03-13 DIAGNOSIS — R2231 Localized swelling, mass and lump, right upper limb: Secondary | ICD-10-CM | POA: Diagnosis not present

## 2024-03-13 DIAGNOSIS — M79644 Pain in right finger(s): Secondary | ICD-10-CM | POA: Diagnosis not present

## 2024-03-16 DIAGNOSIS — Z111 Encounter for screening for respiratory tuberculosis: Secondary | ICD-10-CM | POA: Diagnosis not present

## 2024-03-27 DIAGNOSIS — M79641 Pain in right hand: Secondary | ICD-10-CM | POA: Diagnosis not present

## 2024-03-27 DIAGNOSIS — M79644 Pain in right finger(s): Secondary | ICD-10-CM | POA: Diagnosis not present

## 2024-03-27 DIAGNOSIS — M25641 Stiffness of right hand, not elsewhere classified: Secondary | ICD-10-CM | POA: Diagnosis not present

## 2024-03-27 DIAGNOSIS — R29898 Other symptoms and signs involving the musculoskeletal system: Secondary | ICD-10-CM | POA: Diagnosis not present

## 2024-03-27 DIAGNOSIS — S61209D Unspecified open wound of unspecified finger without damage to nail, subsequent encounter: Secondary | ICD-10-CM | POA: Diagnosis not present

## 2024-03-27 DIAGNOSIS — S61411D Laceration without foreign body of right hand, subsequent encounter: Secondary | ICD-10-CM | POA: Diagnosis not present

## 2024-04-10 DIAGNOSIS — S61411D Laceration without foreign body of right hand, subsequent encounter: Secondary | ICD-10-CM | POA: Diagnosis not present

## 2024-04-10 DIAGNOSIS — M25641 Stiffness of right hand, not elsewhere classified: Secondary | ICD-10-CM | POA: Diagnosis not present

## 2024-04-10 DIAGNOSIS — M79641 Pain in right hand: Secondary | ICD-10-CM | POA: Diagnosis not present

## 2024-04-10 DIAGNOSIS — M79644 Pain in right finger(s): Secondary | ICD-10-CM | POA: Diagnosis not present

## 2024-04-10 DIAGNOSIS — S61209D Unspecified open wound of unspecified finger without damage to nail, subsequent encounter: Secondary | ICD-10-CM | POA: Diagnosis not present

## 2024-04-10 DIAGNOSIS — R29898 Other symptoms and signs involving the musculoskeletal system: Secondary | ICD-10-CM | POA: Diagnosis not present

## 2024-04-14 DIAGNOSIS — R29898 Other symptoms and signs involving the musculoskeletal system: Secondary | ICD-10-CM | POA: Diagnosis not present

## 2024-04-14 DIAGNOSIS — M79644 Pain in right finger(s): Secondary | ICD-10-CM | POA: Diagnosis not present

## 2024-04-14 DIAGNOSIS — S61209D Unspecified open wound of unspecified finger without damage to nail, subsequent encounter: Secondary | ICD-10-CM | POA: Diagnosis not present

## 2024-04-14 DIAGNOSIS — S61411D Laceration without foreign body of right hand, subsequent encounter: Secondary | ICD-10-CM | POA: Diagnosis not present

## 2024-04-14 DIAGNOSIS — M25641 Stiffness of right hand, not elsewhere classified: Secondary | ICD-10-CM | POA: Diagnosis not present

## 2024-04-14 DIAGNOSIS — M79641 Pain in right hand: Secondary | ICD-10-CM | POA: Diagnosis not present

## 2024-05-01 DIAGNOSIS — M25641 Stiffness of right hand, not elsewhere classified: Secondary | ICD-10-CM | POA: Diagnosis not present

## 2024-05-01 DIAGNOSIS — M79644 Pain in right finger(s): Secondary | ICD-10-CM | POA: Diagnosis not present

## 2024-05-01 DIAGNOSIS — M79641 Pain in right hand: Secondary | ICD-10-CM | POA: Diagnosis not present

## 2024-05-01 DIAGNOSIS — R29898 Other symptoms and signs involving the musculoskeletal system: Secondary | ICD-10-CM | POA: Diagnosis not present

## 2024-05-01 DIAGNOSIS — S61411D Laceration without foreign body of right hand, subsequent encounter: Secondary | ICD-10-CM | POA: Diagnosis not present

## 2024-05-01 DIAGNOSIS — S61209D Unspecified open wound of unspecified finger without damage to nail, subsequent encounter: Secondary | ICD-10-CM | POA: Diagnosis not present

## 2024-07-14 DIAGNOSIS — Z5321 Procedure and treatment not carried out due to patient leaving prior to being seen by health care provider: Secondary | ICD-10-CM | POA: Diagnosis not present

## 2024-07-14 DIAGNOSIS — R1110 Vomiting, unspecified: Secondary | ICD-10-CM | POA: Diagnosis not present
